# Patient Record
Sex: Female | Born: 2000 | Race: White | Hispanic: No | Marital: Single | Smoking: Never smoker
Health system: Southern US, Community
[De-identification: ages and names within clinical notes are randomized; demographics above are authoritative.]

## PROBLEM LIST (undated history)

## (undated) DIAGNOSIS — F32A Depression, unspecified: Secondary | ICD-10-CM

## (undated) DIAGNOSIS — Z8742 Personal history of other diseases of the female genital tract: Secondary | ICD-10-CM

## (undated) DIAGNOSIS — N83519 Torsion of ovary and ovarian pedicle, unspecified side: Secondary | ICD-10-CM

## (undated) DIAGNOSIS — F419 Anxiety disorder, unspecified: Secondary | ICD-10-CM

## (undated) HISTORY — DX: Personal history of other diseases of the female genital tract: Z87.42

## (undated) HISTORY — DX: Anxiety disorder, unspecified: F41.9

## (undated) HISTORY — DX: Torsion of ovary and ovarian pedicle, unspecified side: N83.519

## (undated) HISTORY — DX: Depression, unspecified: F32.A

## (undated) HISTORY — PX: OTHER SURGICAL HISTORY: SHX169

---

## 2021-06-01 ENCOUNTER — Emergency Department
Admission: EM | Admit: 2021-06-01 | Discharge: 2021-06-02 | Disposition: A | Discharge status: Home / Self Care | Payer: BC Managed Care – PPO | Attending: Emergency Medicine | Admitting: Emergency Medicine

## 2021-06-01 ENCOUNTER — Other Ambulatory Visit: Payer: Self-pay

## 2021-06-01 DIAGNOSIS — R1013 Epigastric pain: Secondary | ICD-10-CM | POA: Diagnosis present

## 2021-06-01 DIAGNOSIS — R197 Diarrhea, unspecified: Secondary | ICD-10-CM | POA: Insufficient documentation

## 2021-06-01 DIAGNOSIS — K8020 Calculus of gallbladder without cholecystitis without obstruction: Secondary | ICD-10-CM

## 2021-06-01 DIAGNOSIS — D72829 Elevated white blood cell count, unspecified: Secondary | ICD-10-CM | POA: Insufficient documentation

## 2021-06-01 LAB — COMPREHENSIVE METABOLIC PANEL
ALT: 14 U/L (ref 0–44)
AST: 19 U/L (ref 15–41)
Albumin: 3.7 g/dL (ref 3.5–5.0)
Alkaline Phosphatase: 72 U/L (ref 38–126)
Anion gap: 13 (ref 5–15)
BUN: 16 mg/dL (ref 6–20)
CO2: 18 mmol/L — ABNORMAL LOW (ref 22–32)
Calcium: 8.9 mg/dL (ref 8.9–10.3)
Chloride: 106 mmol/L (ref 98–111)
Creatinine, Ser: 0.63 mg/dL (ref 0.44–1.00)
GFR, Estimated: >60 mL/min (ref >60)
Glucose, Bld: 113 mg/dL — ABNORMAL HIGH (ref 70–99)
Potassium: 3.5 mmol/L (ref 3.5–5.1)
Sodium: 137 mmol/L (ref 135–145)
Total Bilirubin: 0.4 mg/dL (ref 0.3–1.2)
Total Protein: 7.7 g/dL (ref 6.5–8.1)

## 2021-06-01 LAB — POC URINE PREG, ED: Preg Test, Ur: NEGATIVE

## 2021-06-01 LAB — URINALYSIS, ROUTINE W REFLEX MICROSCOPIC
Bilirubin Urine: NEGATIVE
Glucose, UA: NEGATIVE mg/dL
Hgb urine dipstick: NEGATIVE
Ketones, ur: NEGATIVE mg/dL
Leukocytes,Ua: NEGATIVE
Nitrite: NEGATIVE
Protein, ur: NEGATIVE mg/dL
Specific Gravity, Urine: 1.02 (ref 1.005–1.030)
pH: 6 (ref 5.0–8.0)

## 2021-06-01 LAB — CBC
HCT: 44.2 % (ref 36.0–46.0)
Hemoglobin: 14.1 g/dL (ref 12.0–15.0)
MCH: 26 pg (ref 26.0–34.0)
MCHC: 31.9 g/dL (ref 30.0–36.0)
MCV: 81.4 fL (ref 80.0–100.0)
Platelets: 448 10*3/uL — ABNORMAL HIGH (ref 150–400)
RBC: 5.43 MIL/uL — ABNORMAL HIGH (ref 3.87–5.11)
RDW: 13.4 % (ref 11.5–15.5)
WBC: 21.5 10*3/uL — ABNORMAL HIGH (ref 4.0–10.5)
nRBC: 0 % (ref 0.0–0.2)

## 2021-06-01 LAB — LIPASE, BLOOD: Lipase: 26 U/L (ref 11–51)

## 2021-06-01 MED ORDER — ONDANSETRON HCL 4 MG/2ML IJ SOLN
4.0000 mg | Freq: Once | INTRAMUSCULAR | Status: AC | PRN
Start: 2021-06-01 — End: 2021-06-01
  Administered 2021-06-01: 4 mg via INTRAVENOUS
  Filled 2021-06-01: qty 2

## 2021-06-01 NOTE — ED Provider Notes (Signed)
? ?[facility] ?Provider Note ? ? ? Event Date/Time  ? First MD Initiated Contact with Patient 06/01/21 2356   ?  (approximate) ? ? ?History  ? ?Diarrhea, Emesis, and Abdominal Pain ? ? ?HPI ? ?Victoria Underwood is a 21 y.o. female who presents to the ED for evaluation of Diarrhea, Emesis, and Abdominal Pain ?  ?Morbidly obese patient without much history in the chart.  No documented surgical history within the abdomen. ? ?Patient presents to the ED, accompanied by her sister, for evaluation of acute epigastric abdominal pain, emesis and an episode of diarrhea.  She reports having crab legs for dinner tonight, and about 1.5 hours after that she developed epigastric pain and recurrent nonbloody nonbilious emesis.  Reports an episode of watery diarrhea alongside this without melena, hematochezia or hematemesis. ? ?Denies fevers or pain prior to tonight's.  Denies known history of cholelithiasis.  Denies dysuria, hematuria, novel vaginal discharge or bleeding. ? ?She does self-reports a history of ovarian torsion on the left side requiring "microsurgery" when she was 21 years old. ? ?Physical Exam  ? ?Triage Vital Signs: ?ED Triage Vitals [06/01/21 2243]  ?Enc Vitals Group  ?   BP 123/63  ?   Pulse Rate 95  ?   Resp (!) 22  ?   Temp 97.6 ?F (36.4 ?C)  ?   Temp Source Oral  ?   SpO2 100 %  ?   Weight 286 lb 2.5 oz (129.8 kg)  ?   Height 5\' 5"  (1.651 m)  ?   Head Circumference   ?   Peak Flow   ?   Pain Score 7  ?   Pain Loc   ?   Pain Edu?   ?   Excl. in [place]?   ? ? ?Most recent vital signs: ?Vitals:  ? 06/02/21 0020 06/02/21 0030  ?BP: 127/79 130/78  ?Pulse: 99 94  ?Resp: 19 20  ?Temp:  98.5 ?F (36.9 ?C)  ?SpO2: 100% 99%  ? ? ?General: Awake, no distress.  Morbidly obese, well-appearing and conversational. ?CV:  Good peripheral perfusion.  ?Resp:  Normal effort.  ?Abd:  No distention.  Epigastric and RUQ tenderness without guarding or peritoneal features.  Remainder of the abdomen is  benign. ?MSK:  No deformity noted.  ?Neuro:  No focal deficits appreciated. ?Other:   ? ? ?ED Results / Procedures / Treatments  ? ?Labs ?(all labs ordered are listed, but only abnormal results are displayed) ?Labs Reviewed  ?COMPREHENSIVE METABOLIC PANEL - Abnormal; Notable for the following components:  ?    Result Value  ? CO2 18 (*)   ? Glucose, Bld 113 (*)   ? All other components within normal limits  ?CBC - Abnormal; Notable for the following components:  ? WBC 21.5 (*)   ? RBC 5.43 (*)   ? Platelets 448 (*)   ? All other components within normal limits  ?URINALYSIS, ROUTINE W REFLEX MICROSCOPIC - Abnormal; Notable for the following components:  ? Color, Urine YELLOW (*)   ? APPearance HAZY (*)   ? All other components within normal limits  ?LIPASE, BLOOD  ?PREGNANCY, URINE  ?POC URINE PREG, ED  ? ? ?EKG ? ? ?RADIOLOGY ?RUQ ultrasound reviewed by me with cholelithiasis without evidence of cholecystitis  ? ?Official radiology report(s): ?US ABDOMEN LIMITED RUQ (LIVER/GB) ? ?Result Date: 06/02/2021 ?CLINICAL DATA:  Right upper quadrant pain EXAM: ULTRASOUND ABDOMEN LIMITED RIGHT UPPER QUADRANT COMPARISON:  None. FINDINGS: Gallbladder:  Multiple mobile gallstones measuring up to 11 mm A negative sonographic Percell Miller sign was reported by the sonographer. Common bile duct: Diameter: 5 mm Liver: There is an echogenic focus in the right hepatic lobe measuring 2.3 x 1.7 x 2.8 cm. Portal vein is patent on color Doppler imaging with normal direction of blood flow towards the liver. Other: None. IMPRESSION: 1. Cholelithiasis without other evidence of acute cholecystitis. 2. Echogenic right hepatic lobe lesion, possibly focal fatty infiltration or a hemangioma. Electronically Signed   By: Ulyses Jarred M.D.   On: 06/02/2021 00:42   ? ?PROCEDURES and INTERVENTIONS: ? ?.1-3 Lead EKG Interpretation ?Performed by: Vladimir Crofts, MD ?Authorized by: Vladimir Crofts, MD  ? ?  Interpretation: normal   ?  ECG rate:  90 ?  ECG rate  assessment: normal   ?  Rhythm: sinus rhythm   ?  Ectopy: none   ?  Conduction: normal   ? ?Medications  ?ondansetron (ZOFRAN) injection 4 mg (4 mg Intravenous Given 06/01/21 2248)  ? ? ? ?IMPRESSION / MDM / ASSESSMENT AND PLAN / ED COURSE  ?I reviewed the triage vital signs and the nursing notes. ? ?Morbidly obese 21 year old woman presents to the ED with postprandial emesis and epigastric pain, with evidence of cholelithiasis without cholecystitis and suitable for outpatient management with surgical follow-up.  Vital signs are reassuring and normal.  Examination with mild epigastric and RUQ tenderness without guarding or peritoneal features.  She looks systemically well.  Leukocytosis to 21,000 as noted as well as slightly decreased bicarbonate.  Normal electrolytes and renal function.  No evidence of pancreatitis, cystitis or pregnancy.  RUQ ultrasound with evidence of gallstones without cholecystitis sonographically.  No signs of biliary obstruction.  While her leukocytosis may be suggestive of cholecystitis, clinically and overall she does not have signs of this.  She is tolerating p.o. intake and I believe she is suitable for outpatient management.  Return precautions discussed and referred to surgery. ? ?Clinical Course as of 06/02/21 0118  ?Nancy Fetter Jun 02, 2021  ?0055 Reassessed.  Patient reports feeling fine.  Continues to have some mild tenderness to deep palpation but no guarding or peritoneal features.  We discussed gallstones without evidence of cholecystitis.  Discussed management at home and following up with surgeon.  We discussed return precautions.  Answered questions [DS]  ?  ?Clinical Course User Index ?[DS] Vladimir Crofts, MD  ? ? ? ?FINAL CLINICAL IMPRESSION(S) / ED DIAGNOSES  ? ?Final diagnoses:  ?Calculus of gallbladder without cholecystitis without obstruction  ? ? ? ?Rx / DC Orders  ? ?ED Discharge Orders   ? ?      Ordered  ?  ondansetron (ZOFRAN-ODT) 4 MG disintegrating tablet  Every 8 hours PRN        ? 06/02/21 0118  ? ?  ?  ? ?  ? ? ? ?Note:  This document was prepared using Dragon voice recognition software and may include unintentional dictation errors. ?  ?Vladimir Crofts, MD ?06/02/21 0118 ? ?

## 2021-06-01 NOTE — ED Triage Notes (Signed)
Pt states she started having diarrhea/ vomiting and epigastric pain starting around an hour and a half ago.  ?

## 2021-06-02 ENCOUNTER — Emergency Department: Payer: BC Managed Care – PPO

## 2021-06-02 LAB — PREGNANCY, URINE: Preg Test, Ur: NEGATIVE

## 2021-06-02 MED ORDER — ONDANSETRON 4 MG PO TBDP
4.0000 mg | ORAL_TABLET | Freq: Three times a day (TID) | ORAL | 0 refills | Status: DC | PRN
Start: 2021-06-02 — End: 2021-09-16

## 2021-06-02 NOTE — ED Notes (Signed)
Pt was given cracker and water.  ?

## 2021-06-02 NOTE — ED Notes (Signed)
Pt present to ED with NVD and epigastric abdominal pain that started last night after eating some seafood. Pt states she vomited 4-5 times today. Pt states her pain eased up after receiving pain medication.  ?

## 2021-06-02 NOTE — ED Notes (Signed)
Pt verbalized understanding of discharge instructions, follow-up care instructions, and prescriptions. Pt advised if symptoms worsen to return to ED.  ?

## 2021-06-02 NOTE — Discharge Instructions (Addendum)
Please take Tylenol and ibuprofen/Advil for your pain.  It is safe to take them together, or to alternate them every few hours.  Take up to 1000mg  of Tylenol at a time, up to 4 times per day.  Do not take more than 4000 mg of Tylenol in 24 hours.  For ibuprofen, take 400-600 mg, 4-5 times per day. ? ?Use Zofran as needed for any further nausea and vomiting. ? ?If you develop any unbearable pain, fevers and pain or further worsening symptoms, please return to the ED. ?

## 2021-06-02 NOTE — ED Notes (Signed)
Pt tolerated crackers and water well. No c/o nausea or vomiting.  ?

## 2021-06-14 ENCOUNTER — Ambulatory Visit: Payer: Self-pay | Admitting: General Surgery

## 2021-06-14 NOTE — H&P (Signed)
?PATIENT PROFILE: ?Victoria Underwood is a 21 y.o. female who presents to the Clinic for consultation at the request of Dr. 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 ? ?PCP:  Pcp, No ? ?HISTORY OF PRESENT ILLNESS: ?Victoria Underwood reports she had an episode of severe epigastric pain.  Epigastric pain radiates to the right upper quadrant.  Pain was exacerbated by eating crab.  There was no alleviating factors.  She went to the ED due to the severity of the pain.  At the ED she had an ultrasound that was found with cholelithiasis.  There was no gallbladder wall thickening or pericholecystic edema.  I personally evaluated the images.  She also had leukocytosis.  The liver enzymes were within normal limits.  I personally evaluated the labs from the ED visit.  Since the pain completely resolved at the moment and ultrasound findings were not consistent with cholecystitis she was discharged home.  She endorses that she has not had any more pain episode like this. ? ? ?PROBLEM LIST: ?Cholelithiasis ?Morbid obesity ? ?GENERAL REVIEW OF SYSTEMS:  ? ?General ROS: negative for - chills, fatigue, fever, weight gain or weight loss ?Allergy and Immunology ROS: negative for - hives  ?Hematological and Lymphatic ROS: negative for - bleeding problems or bruising, negative for palpable nodes ?Endocrine ROS: negative for - heat or cold intolerance, hair changes ?Respiratory ROS: negative for - cough, shortness of breath or wheezing ?Cardiovascular ROS: no chest pain or palpitations ?GI ROS: negative for nausea, vomiting, diarrhea, constipation.  Positive for abdominal pain ?Musculoskeletal ROS: negative for - joint swelling or muscle pain ?Neurological ROS: negative for - confusion, syncope ?Dermatological ROS: negative for pruritus and rash ?Psychiatric: negative for anxiety, depression, difficulty sleeping and memory loss ? ?MEDICATIONS: ?Current Outpatient Medications  ?Medication Sig Dispense Refill  ? FLUoxetine  (PROZAC) 10 MG capsule Take 10 mg by mouth once daily    ? ?No current facility-administered medications for this visit.  ? ? ?ALLERGIES: ?Patient has no known allergies. ? ?PAST MEDICAL HISTORY: ?Morbid obesity ? ?PAST SURGICAL HISTORY: ?Patient denies previous surgical history ? ?FAMILY HISTORY: ?Family history reviewed.  No pertinent family history ? ?SOCIAL HISTORY: ?Social History  ? ?Socioeconomic History  ? Marital status: Single  ?Tobacco Use  ? Smoking status: Never  ? Smokeless tobacco: Never  ?Substance and Sexual Activity  ? Alcohol use: Never  ? Drug use: Never  ? ? ?PHYSICAL EXAM: ?Vitals:  ? 06/13/21 1043  ?BP: (!) 149/88  ?Pulse: 99  ? ?Body mass index is 47.43 kg/m?. ?Weight: (!) 129.3 kg (285 lb)  ? ?GENERAL: Alert, active, oriented x3 ? ?HEENT: Pupils equal reactive to light. Extraocular movements are intact. Sclera clear. Palpebral conjunctiva normal red color.Pharynx clear. ? ?NECK: Supple with no palpable mass and no adenopathy. ? ?LUNGS: Sound clear with no rales rhonchi or wheezes. ? ?HEART: Regular rhythm S1 and S2 without murmur. ? ?ABDOMEN: Soft and depressible, nontender with no palpable mass, no hepatomegaly.  ? ?EXTREMITIES: Well-developed well-nourished symmetrical with no dependent edema. ? ?NEUROLOGICAL: Awake alert oriented, facial expression symmetrical, moving all extremities. ? ?REVIEW OF DATA: ?I have reviewed the following data today: ?No results found for any previous visit.  ?  ? ?ASSESSMENT: ?Victoria Underwood is a 21 y.o. female presenting for consultation for cholelithiasis.   ? ?Patient was oriented about the diagnosis of cholelithiasis. Also oriented about what is the gallbladder, its anatomy and function and the implications of having stones. The patient was oriented about the treatment alternatives (  observation vs cholecystectomy). Patient was oriented that a low percentage of patient will continue to have similar pain symptoms even after the gallbladder is removed.  Surgical technique (open vs laparoscopic) was discussed. It was also discussed the goals of the surgery (decrease the pain episodes and avoid the risk of cholecystitis) and the risk of surgery including: bleeding, infection, common bile duct injury, stone retention, injury to other organs such as bowel, liver, stomach, other complications such as hernia, bowel obstruction among others. Also discussed with patient about anesthesia and its complications such as: reaction to medications, pneumonia, heart complications, death, among others.   Patient endorses she understood and agreed to proceed after sister's wedding. ? ?I also had a discussion with the patient and her mother regarding the patient's weight.  Patient has a BMI of 47.4.  I highly recommend the patient to have an evaluation at the weight loss clinic for discussion of alternative of healthy weight loss. ? ?Cholelithiasis without cholecystitis [K80.20] ? ?PLAN: ?1.  Robotic assisted laparoscopic cholecystectomy (18841) ?2.  CBC, CMP (done) ?3.  Do not take aspirin 5 days before the procedure ?4.  Contact us if has any question or concern.  ?5.  Outpatient referral to to Weight Loss Clinic ? ?Patient and and mother verbalized understanding, all questions were answered, and were agreeable with the plan outlined above.  ? ? ? ?Carolan Shiver, MD ? ?Electronically signed by Carolan Shiver, MD ?

## 2021-07-17 ENCOUNTER — Inpatient Hospital Stay
Admission: RE | Admit: 2021-07-17 | Discharge: 2021-07-17 | Disposition: A | Discharge status: Home / Self Care | Payer: BC Managed Care – PPO | Source: Ambulatory Visit

## 2021-07-17 NOTE — Patient Instructions (Signed)
Your procedure is scheduled on: 07/24/21 - Wednesday Report to the Registration Desk on the 1st floor of the [facility]. To find out your arrival time, please call [phone] between 1PM - 3PM on: 07/23/21 - Tuesday If your arrival time is 6:00 am, do not arrive prior to that time as the [facility] entrance doors do not open until 6:00 am.  REMEMBER: Instructions that are not followed completely may result in serious medical risk, up to and including death; or upon the discretion of your surgeon and anesthesiologist your surgery may need to be rescheduled.  Do not eat food after midnight the night before surgery.  No gum chewing, lozengers or hard candies.  TAKE THESE MEDICATIONS THE MORNING OF SURGERY WITH A SIP OF WATER: NONE  One week prior to surgery: Stop Anti-inflammatories (NSAIDS) such as Advil, Aleve, Ibuprofen, Motrin, Naproxen, Naprosyn and Aspirin based products such as Excedrin, Goodys Powder, BC Powder.  Stop ANY OVER THE COUNTER supplements until after surgery.  You may however, continue to take Tylenol if needed for pain up until the day of surgery.  No Alcohol for 24 hours before or after surgery.  No Smoking including e-cigarettes for 24 hours prior to surgery.  No chewable tobacco products for at least 6 hours prior to surgery.  No nicotine patches on the day of surgery.  Do not use any "recreational" drugs for at least a week prior to your surgery.  Please be advised that the combination of cocaine and anesthesia may have negative outcomes, up to and including death. If you test positive for cocaine, your surgery will be cancelled.  On the morning of surgery brush your teeth with toothpaste and water, you may rinse your mouth with mouthwash if you wish. Do not swallow any toothpaste or mouthwash.  Use CHG Soap or wipes as directed on instruction sheet.  Do not wear jewelry, make-up, hairpins, clips or nail polish.  Do not wear lotions, powders, or  perfumes.   Do not shave body from the neck down 48 hours prior to surgery just in case you cut yourself which could leave a site for infection.  Also, freshly shaved skin may become irritated if using the CHG soap.  Contact lenses, hearing aids and dentures may not be worn into surgery.  Do not bring valuables to the hospital. [facility] is not responsible for any missing/lost belongings or valuables.   Notify your doctor if there is any change in your medical condition (cold, fever, infection).  Wear comfortable clothing (specific to your surgery type) to the hospital.  After surgery, you can help prevent lung complications by doing breathing exercises.  Take deep breaths and cough every 1-2 hours. Your doctor may order a device called an Incentive Spirometer to help you take deep breaths. When coughing or sneezing, hold a pillow firmly against your incision with both hands. This is called "splinting." Doing this helps protect your incision. It also decreases belly discomfort.  If you are being admitted to the hospital overnight, leave your suitcase in the car. After surgery it may be brought to your room.  If you are being discharged the day of surgery, you will not be allowed to drive home. You will need a responsible adult (18 years or older) to drive you home and stay with you that night.   If you are taking public transportation, you will need to have a responsible adult (18 years or older) with you. Please confirm with your physician that  it is acceptable to use public transportation.   Please call the [place] [facility] at [phone] if you have any questions about these instructions.  Surgery Visitation Policy:  Patients undergoing a surgery or procedure may have two family members or support persons with them as long as the person is not COVID-19 positive or experiencing its symptoms.   Inpatient Visitation:    Visiting hours are 7 a.m. to 8 p.m. Up to  four visitors are allowed at one time in a patient room, including children. The visitors may rotate out with other people during the day. One designated support person (adult) may remain overnight.

## 2021-07-24 ENCOUNTER — Encounter: Admission: RE | Payer: Self-pay | Source: Home / Self Care

## 2021-07-24 ENCOUNTER — Ambulatory Visit: Admission: RE | Admit: 2021-07-24 | Payer: BC Managed Care – PPO | Source: Home / Self Care | Admitting: General Surgery

## 2021-07-24 SURGERY — CHOLECYSTECTOMY, ROBOT-ASSISTED, LAPAROSCOPIC
Anesthesia: General | Site: Abdomen

## 2021-09-16 ENCOUNTER — Other Ambulatory Visit: Payer: Self-pay

## 2021-09-16 ENCOUNTER — Encounter: Payer: Self-pay | Admitting: Registered Nurse

## 2021-09-16 ENCOUNTER — Ambulatory Visit (INDEPENDENT_AMBULATORY_CARE_PROVIDER_SITE_OTHER): Payer: BC Managed Care – PPO | Admitting: Registered Nurse

## 2021-09-16 VITALS — BP 132/86 | HR 121 | Temp 98.1°F | Resp 18 | Ht 65.0 in | Wt 286.6 lb

## 2021-09-16 DIAGNOSIS — Z3009 Encounter for other general counseling and advice on contraception: Secondary | ICD-10-CM | POA: Diagnosis not present

## 2021-09-16 DIAGNOSIS — T753XXA Motion sickness, initial encounter: Secondary | ICD-10-CM | POA: Insufficient documentation

## 2021-09-16 DIAGNOSIS — H60501 Unspecified acute noninfective otitis externa, right ear: Secondary | ICD-10-CM

## 2021-09-16 DIAGNOSIS — Z3041 Encounter for surveillance of contraceptive pills: Secondary | ICD-10-CM

## 2021-09-16 DIAGNOSIS — F419 Anxiety disorder, unspecified: Secondary | ICD-10-CM

## 2021-09-16 DIAGNOSIS — Z0000 Encounter for general adult medical examination without abnormal findings: Secondary | ICD-10-CM | POA: Diagnosis not present

## 2021-09-16 DIAGNOSIS — F40243 Fear of flying: Secondary | ICD-10-CM

## 2021-09-16 MED ORDER — CLONAZEPAM 0.5 MG PO TBDP: 0.5000 mg | ORAL_TABLET | Freq: Three times a day (TID) | ORAL | 0 refills | Status: DC | PRN

## 2021-09-16 MED ORDER — SCOPOLAMINE 1 MG/3DAYS TD PT72: 1.0000 | MEDICATED_PATCH | TRANSDERMAL | 12 refills | Status: DC

## 2021-09-16 MED ORDER — NORGESTIMATE-ETH ESTRADIOL 0.25-35 MG-MCG PO TABS: 1.0000 | ORAL_TABLET | Freq: Every day | ORAL | 4 refills | Status: DC

## 2021-09-16 MED ORDER — OFLOXACIN 0.3 % OT SOLN: 10.0000 [drp] | Freq: Every day | OTIC | 0 refills | Status: DC

## 2021-09-16 NOTE — Progress Notes (Signed)
Complete physical exam  Patient: Victoria Underwood   DOB: 2000-05-12   21 y.o. Female  MRN: [id number] Visit Date: 09/16/2021  Subjective:    Chief Complaint  Patient presents with   New Patient (Initial Visit)    Patient states she is here to establish care and discuss motion sickness    Victoria Underwood is a 21 y.o. female who presents today for a complete physical exam. She reports consuming a general diet.     She generally feels well. She reports sleeping well. She does have additional problems to discuss today.   Vision:Within the last year Dental:Within Last 6 months STD Screen:No PSA:No  Ear Pain R ear. Canal pain and mild outer ear pain Some local lymph pain No drainage Occ muffled hearing. No other upper respiratory symptoms.  Motion Sickness Worst on planes. Concern about anxiety component Has tried a number of OTC remedies without relief. Would like to try rx.  BCM Has been on COCs for some time.  Would like to continue. No AE. Good compliance.   Most recent fall risk assessment:    09/16/2021    2:47 PM  Fall Risk   Falls in the past year? 0  Number falls in past yr: 0  Injury with Fall? 0  Risk for fall due to : No Fall Risks  Follow up Falls evaluation completed     Most recent depression screenings:    09/16/2021    2:47 PM  PHQ 2/9 Scores  PHQ - 2 Score 0  PHQ- 9 Score 0     Patient Active Problem List   Diagnosis Date Noted   Counseling for birth control, oral contraceptives 09/16/2021   Motion sickness 09/16/2021   Past Medical History:  Diagnosis Date   Anxiety    Depression    History reviewed. No pertinent surgical history. Social History   Tobacco Use   Smoking status: Never   Smokeless tobacco: Never  Vaping Use   Vaping Use: Never used  Substance Use Topics   Alcohol use: Never   Drug use: Never   Social History   Socioeconomic History   Marital status: Single    Spouse name: Not on file   Number of  children: Not on file   Years of education: Not on file   Highest education level: Not on file  Occupational History   Not on file  Tobacco Use   Smoking status: Never   Smokeless tobacco: Never  Vaping Use   Vaping Use: Never used  Substance and Sexual Activity   Alcohol use: Never   Drug use: Never   Sexual activity: Never  Other Topics Concern   Not on file  Social History Narrative   Not on file   Social Determinants of Health   Financial Resource Strain: Not on file  Food Insecurity: Not on file  Transportation Needs: Not on file  Physical Activity: Not on file  Stress: Not on file  Social Connections: Not on file  Intimate Partner Violence: Not on file   Family Status  Relation Name Status   Mother Thornell Sartorius (Not Specified)   Father Acupuncturist (Not Specified)   Sister Irving Burton (Not Specified)   Family History  Problem Relation Age of Onset   Depression Mother    Arthritis Father    Depression Father    Anxiety disorder Sister    No Known Allergies   Patient Care Team: Janeece Agee, NP as PCP - General (Adult Health Nurse Practitioner)  Medications: Outpatient Medications Prior to Visit  Medication Sig   atomoxetine (STRATTERA) 18 MG capsule    FLUoxetine (PROZAC) 20 MG capsule    [DISCONTINUED] norgestimate-ethinyl estradiol (ORTHO-CYCLEN) 0.25-35 MG-MCG tablet Take 1 tablet by mouth daily.   [DISCONTINUED] ondansetron (ZOFRAN-ODT) 4 MG disintegrating tablet Take 1 tablet (4 mg total) by mouth every 8 (eight) hours as needed.   No facility-administered medications prior to visit.    Review of Systems  Constitutional: Negative.   HENT: Negative.    Eyes: Negative.   Respiratory: Negative.    Cardiovascular: Negative.   Gastrointestinal: Negative.   Genitourinary: Negative.   Musculoskeletal: Negative.   Skin: Negative.   Neurological: Negative.   Psychiatric/Behavioral: Negative.    All other systems reviewed and are negative.   Last CBC Lab  Results  Component Value Date   WBC 21.5 (H) 06/01/2021   HGB 14.1 06/01/2021   HCT 44.2 06/01/2021   MCV 81.4 06/01/2021   MCH 26.0 06/01/2021   RDW 13.4 06/01/2021   PLT 448 (H) 06/01/2021   Last metabolic panel Lab Results  Component Value Date   GLUCOSE 113 (H) 06/01/2021   NA 137 06/01/2021   K 3.5 06/01/2021   CL 106 06/01/2021   CO2 18 (L) 06/01/2021   BUN 16 06/01/2021   CREATININE 0.63 06/01/2021   GFRNONAA >60 06/01/2021   CALCIUM 8.9 06/01/2021   PROT 7.7 06/01/2021   ALBUMIN 3.7 06/01/2021   BILITOT 0.4 06/01/2021   ALKPHOS 72 06/01/2021   AST 19 06/01/2021   ALT 14 06/01/2021   ANIONGAP 13 06/01/2021   Last lipids No results found for: "CHOL", "HDL", "LDLCALC", "LDLDIRECT", "TRIG", "CHOLHDL" Last hemoglobin A1c No results found for: "HGBA1C" Last thyroid functions No results found for: "TSH", "T3TOTAL", "T4TOTAL", "THYROIDAB" Last vitamin D No results found for: "25OHVITD2", "25OHVITD3", "VD25OH" Last vitamin B12 and Folate No results found for: "VITAMINB12", "FOLATE"      Objective:     BP 132/86   Pulse (!) 121   Temp 98.1 F (36.7 C) (Temporal)   Resp 18   Ht 5\' 5"  (1.651 m)   Wt 286 lb 9.6 oz (130 kg)   SpO2 99%   BMI 47.69 kg/m   BP Readings from Last 3 Encounters:  09/16/21 132/86  06/02/21 130/78   Wt Readings from Last 3 Encounters:  09/16/21 286 lb 9.6 oz (130 kg)  06/01/21 286 lb 2.5 oz (129.8 kg) (>99 %, Z= 2.78)*   * Growth percentiles are based on CDC (Girls, 2-20 Years) data.   SpO2 Readings from Last 3 Encounters:  09/16/21 99%  06/02/21 99%      Physical Exam Vitals and nursing note reviewed.  Constitutional:      General: She is not in acute distress.    Appearance: Normal appearance. She is not ill-appearing, toxic-appearing or diaphoretic.  HENT:     Head: Normocephalic and atraumatic.     Right Ear: Tympanic membrane, ear canal and external ear normal. There is no impacted cerumen.     Left Ear: Tympanic  membrane, ear canal and external ear normal. There is no impacted cerumen.     Nose: Nose normal. No congestion or rhinorrhea.     Mouth/Throat:     Mouth: Mucous membranes are moist.     Pharynx: Oropharynx is clear. No oropharyngeal exudate or posterior oropharyngeal erythema.  Eyes:     General: No scleral icterus.       Right eye: No discharge.  Left eye: No discharge.     Extraocular Movements: Extraocular movements intact.     Conjunctiva/sclera: Conjunctivae normal.     Pupils: Pupils are equal, round, and reactive to light.  Neck:     Vascular: No carotid bruit.  Cardiovascular:     Rate and Rhythm: Normal rate and regular rhythm.     Pulses: Normal pulses.     Heart sounds: Normal heart sounds. No murmur heard.    No friction rub. No gallop.  Pulmonary:     Effort: Pulmonary effort is normal. No respiratory distress.     Breath sounds: Normal breath sounds. No stridor. No wheezing, rhonchi or rales.  Chest:     Chest wall: No tenderness.  Abdominal:     General: Abdomen is flat. Bowel sounds are normal. There is no distension.     Palpations: There is no mass.     Tenderness: There is no abdominal tenderness. There is no right CVA tenderness, left CVA tenderness, guarding or rebound.     Hernia: No hernia is present.  Musculoskeletal:        General: No swelling, tenderness, deformity or signs of injury. Normal range of motion.     Cervical back: Normal range of motion and neck supple. No rigidity or tenderness.     Right lower leg: No edema.     Left lower leg: No edema.  Lymphadenopathy:     Cervical: No cervical adenopathy.  Skin:    General: Skin is warm and dry.     Capillary Refill: Capillary refill takes less than 2 seconds.     Coloration: Skin is not jaundiced or pale.     Findings: No bruising, erythema, lesion or rash.  Neurological:     General: No focal deficit present.     Mental Status: She is alert and oriented to person, place, and time.  Mental status is at baseline.     Cranial Nerves: No cranial nerve deficit.     Sensory: No sensory deficit.     Motor: No weakness.     Coordination: Coordination normal.     Gait: Gait normal.     Deep Tendon Reflexes: Reflexes normal.  Psychiatric:        Mood and Affect: Mood normal.        Behavior: Behavior normal.        Thought Content: Thought content normal.        Judgment: Judgment normal.      No results found for any visits on 09/16/21.    Assessment & Plan:    Routine Health Maintenance and Physical Exam   There is no immunization history on file for this patient.  Health Maintenance  Topic Date Due   Hepatitis C Screening  Never done   COVID-19 Vaccine (1) 10/02/2021 (Originally 12/26/2001)   HPV VACCINES (1 - 2-dose series) 09/17/2022 (Originally 06/25/2012)   TETANUS/TDAP  09/17/2022 (Originally 06/25/2020)   HIV Screening  09/17/2022 (Originally 06/25/2016)   INFLUENZA VACCINE  09/24/2021    Discussed health benefits of physical activity, and encouraged her to engage in regular exercise appropriate for her age and condition.  Problem List Items Addressed This Visit       Other   Counseling for birth control, oral contraceptives   Relevant Medications   norgestimate-ethinyl estradiol (ORTHO-CYCLEN) 0.25-35 MG-MCG tablet   Motion sickness   Relevant Medications   clonazePAM (KLONOPIN) 0.5 MG disintegrating tablet   scopolamine (TRANSDERM-SCOP) 1 MG/3DAYS   Other Visit  Diagnoses     Annual physical exam    -  Primary   Acute otitis externa of right ear, unspecified type       Relevant Medications   ofloxacin (FLOXIN) 0.3 % OTIC solution      Return in about 1 year (around 09/17/2022) for CPE and labs.     PLAN Exam notable for otitis externa. Treat with ofloxacin drops Otherwise exam unremarkable Refill COCs Given clonazepam and scopolamine for motion sickness. Reviewed risks, benefits, and side effects, pt voices understanding. Patient  encouraged to call clinic with any questions, comments, or concerns.   Maximiano Coss, NP

## 2021-09-16 NOTE — Patient Instructions (Addendum)
  Lizzy -  Great to meet you!  Call with concerns.  Let me know if the clonazepam doesn't work.  Physical and labs next year.   I recommend:  Jarold Motto, PA Jacquiline Doe, MD Glenetta Hew, MD Letta Moynahan Early, NP Jiles Prows, DNP Eileen Stanford, MD  Thank you! Stay Well,  Luan Pulling    If you have lab work done today you will be contacted with your lab results within the next 2 weeks.  If you have not heard from [place] then please contact us. The fastest way to get your results is to register for My Chart.   IF you received an x-ray today, you will receive an invoice from [facility]. Please contact [facility] at [phone] with questions or concerns regarding your invoice.   IF you received labwork today, you will receive an invoice from [facility]. Please contact [facility] at [phone] with questions or concerns regarding your invoice.   Our billing staff will not be able to assist you with questions regarding bills from these companies.  You will be contacted with the lab results as soon as they are available. The fastest way to get your results is to activate your My Chart account. Instructions are located on the last page of this paperwork. If you have not heard from [place] regarding the results in 2 weeks, please contact this office.

## 2021-09-19 ENCOUNTER — Encounter: Payer: Self-pay | Admitting: Registered Nurse

## 2021-09-20 ENCOUNTER — Other Ambulatory Visit: Payer: Self-pay | Admitting: Registered Nurse

## 2021-09-20 DIAGNOSIS — T753XXA Motion sickness, initial encounter: Secondary | ICD-10-CM

## 2021-09-23 ENCOUNTER — Other Ambulatory Visit: Payer: Self-pay | Admitting: Registered Nurse

## 2021-09-23 DIAGNOSIS — F40243 Fear of flying: Secondary | ICD-10-CM

## 2021-09-23 DIAGNOSIS — T753XXA Motion sickness, initial encounter: Secondary | ICD-10-CM

## 2021-09-23 MED ORDER — CLONAZEPAM 0.5 MG PO TABS: 0.5000 mg | ORAL_TABLET | Freq: Two times a day (BID) | ORAL | 1 refills | Status: DC | PRN

## 2021-09-23 NOTE — Addendum Note (Signed)
Addended by: Janeece Agee on: 09/23/2021 09:52 AM   Modules accepted: Orders

## 2021-10-18 ENCOUNTER — Ambulatory Visit (INDEPENDENT_AMBULATORY_CARE_PROVIDER_SITE_OTHER): Payer: BC Managed Care – PPO | Admitting: Adult Health

## 2021-10-18 ENCOUNTER — Encounter: Payer: Self-pay | Admitting: Adult Health

## 2021-10-18 VITALS — BP 140/80 | HR 100 | Ht 65.0 in | Wt 285.0 lb

## 2021-10-18 DIAGNOSIS — G4701 Insomnia due to medical condition: Secondary | ICD-10-CM

## 2021-10-18 DIAGNOSIS — F331 Major depressive disorder, recurrent, moderate: Secondary | ICD-10-CM

## 2021-10-18 DIAGNOSIS — F411 Generalized anxiety disorder: Secondary | ICD-10-CM | POA: Diagnosis not present

## 2021-10-18 DIAGNOSIS — F902 Attention-deficit hyperactivity disorder, combined type: Secondary | ICD-10-CM

## 2021-10-18 MED ORDER — FLUOXETINE HCL 10 MG PO CAPS: ORAL_CAPSULE | ORAL | 5 refills | Status: DC

## 2021-10-18 MED ORDER — HYDROXYZINE HCL 25 MG PO TABS: ORAL_TABLET | ORAL | 5 refills | Status: DC

## 2021-10-18 MED ORDER — ATOMOXETINE HCL 18 MG PO CAPS: 18.0000 mg | ORAL_CAPSULE | Freq: Two times a day (BID) | ORAL | 5 refills | Status: DC

## 2021-10-18 NOTE — Progress Notes (Signed)
[facility] MD/PA/NP Initial Note  10/18/2021 4:28 PM Victoria Underwood  MRN:  [id number]  Chief Complaint:   HPI:   Patient seen today for initial psychiatric evaluation.   Accompanied by mother.  Describes mood today as "ok". Pleasant. Denies tearfulness Mood symptoms - denies current depressive symptoms - struggled last semester until she was started on Prozac. Reports anxiety - more sometimes than others - situationally driven. Denies irritability. Reports worry and rumination. Mood is consistent. Currently seen by only tele-health provider since February 2023. Current medications are helpful and would like to continue. Stable interest and motivation. Taking medications as prescribed.  Energy levels stable. Active, does not have a regular exercise routine. Walking to class. Enjoys some usual interests and activities. Single. Has a boyfriend of 2 years - lives in [place]. Spending time with family. Parents local - has 2 brothers and a sister. Appetite adequate.  Weight stable - 285 pounds. Sleeps well most nights. Averages 6 to 7 broken hours. Focus and concentration stable with Stratera. Completing tasks. Managing aspects of household. 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 Lives on campus - has a room-mate. Having some adjustment issues and would like to have her pet bunny "Domingo Cocking" with her. He has been a positive source for the emotional support she's needed since being away from home and parents. Looking for a part-time job.  Denies SI or HI.  Denies AH or VH. Denies self harm. Denies substance use.  Previous medication trials:  Denies  Visit Diagnosis:    ICD-10-CM   1. Generalized anxiety disorder  F41.1 FLUoxetine (PROZAC) 10 MG capsule    2. Attention deficit hyperactivity disorder (ADHD), combined type  F90.2 atomoxetine (STRATTERA) 18 MG capsule    3. Major depressive disorder, recurrent episode, moderate (HCC)  F33.1 FLUoxetine (PROZAC) 10 MG capsule    4. Insomnia due to  medical condition  G47.01 hydrOXYzine (ATARAX) 25 MG tablet      Past Psychiatric History: Denies psychiatric hospitalization.   Past Medical History:  Past Medical History:  Diagnosis Date   Anxiety    Depression    No past surgical history on file.  Family Psychiatric History: Family history of mental illness - depression and anxiety.   Family History:  Family History  Problem Relation Age of Onset   Depression Mother    Arthritis Father    Depression Father    Anxiety disorder Sister     Social History:  Social History   Socioeconomic History   Marital status: Single    Spouse name: Not on file   Number of children: Not on file   Years of education: Not on file   Highest education level: Not on file  Occupational History   Not on file  Tobacco Use   Smoking status: Never   Smokeless tobacco: Never  Vaping Use   Vaping Use: Never used  Substance and Sexual Activity   Alcohol use: Never   Drug use: Never   Sexual activity: Never  Other Topics Concern   Not on file  Social History Narrative   Not on file   Social Determinants of Health   Financial Resource Strain: Not on file  Food Insecurity: Not on file  Transportation Needs: Not on file  Physical Activity: Not on file  Stress: Not on file  Social Connections: Not on file    Allergies: No Known Allergies  Metabolic Disorder Labs: No results found for: "HGBA1C", "MPG" No results found for: "PROLACTIN" No results found for: "CHOL", "  TRIG", "HDL", "CHOLHDL", "VLDL", "LDLCALC" No results found for: "TSH"  Therapeutic Level Labs: No results found for: "LITHIUM" No results found for: "VALPROATE" No results found for: "CBMZ"  Current Medications: Current Outpatient Medications  Medication Sig Dispense Refill   hydrOXYzine (ATARAX) 25 MG tablet Take one to two tablets at bedtime. 60 tablet 5   atomoxetine (STRATTERA) 18 MG capsule Take 1 capsule (18 mg total) by mouth 2 (two) times daily. 60  capsule 5   clonazePAM (KLONOPIN) 0.5 MG tablet Take 1 tablet (0.5 mg total) by mouth 2 (two) times daily as needed for anxiety. 20 tablet 1   FLUoxetine (PROZAC) 10 MG capsule Take three capsules daily. 90 capsule 5   norgestimate-ethinyl estradiol (ORTHO-CYCLEN) 0.25-35 MG-MCG tablet Take 1 tablet by mouth daily. 84 tablet 4   ofloxacin (FLOXIN) 0.3 % OTIC solution Place 10 drops into the right ear daily. 10 mL 0   scopolamine (TRANSDERM-SCOP) 1 MG/3DAYS Place 1 patch (1.5 mg total) onto the skin every 3 (three) days. 10 patch 12   No current facility-administered medications for this visit.    Medication Side Effects: none  Orders placed this visit:  No orders of the defined types were placed in this encounter.   Psychiatric Specialty Exam:  Review of Systems  Musculoskeletal:  Negative for gait problem.  Neurological:  Negative for tremors.  Psychiatric/Behavioral:         Please refer to HPI    There were no vitals taken for this visit.There is no height or weight on file to calculate BMI.  General Appearance: Casual and Neat  Eye Contact:  Good  Speech:  Clear and Coherent and Normal Rate  Volume:  Normal  Mood:  Euthymic  Affect:  Appropriate and Congruent  Thought Process:  Coherent and Descriptions of Associations: Intact  Orientation:  Full (Time, Place, and Person)  Thought Content: Logical   Suicidal Thoughts:  No  Homicidal Thoughts:  No  Memory:  WNL  Judgement:  Good  Insight:  Good  Psychomotor Activity:  Normal  Concentration:  Concentration: Good and Attention Span: Good  Recall:  Good  Fund of Knowledge: Good  Language: Good  Assets:  Communication Skills Desire for Improvement Financial Resources/Insurance Housing Intimacy Leisure Time Physical Health Resilience Social Support Talents/Skills Transportation Vocational/Educational  ADL's:  Intact  Cognition: WNL  Prognosis:  Good   Screenings:  PHQ2-9    Flowsheet Row Office Visit from  09/16/2021 in [facility]  PHQ-2 Total Score 0  PHQ-9 Total Score 0      Flowsheet Row ED from 06/01/2021 in [facility]  C-SSRS RISK CATEGORY No Risk       Receiving Psychotherapy: No   Treatment Plan/Recommendations:  Plan:  PDMP reviewed  Prozac 20mg  to30mg  daily Stratera 18mg  BID Propranolol - has a bottle  Hydroxyzine 25mg  - take 1 to 2 tablets.  Time spent with patient was 60 minutes. Greater than 50% of face to face time with patient was spent on counseling and coordination of care.    ESA letter provided for ".  RTC 4 weeks  Patient advised to contact office with any questions, adverse effects, or acute worsening in signs and symptoms.      , NP

## 2021-11-29 ENCOUNTER — Encounter: Payer: Self-pay | Admitting: Adult Health

## 2021-11-29 ENCOUNTER — Ambulatory Visit: Payer: BC Managed Care – PPO | Admitting: Adult Health

## 2021-11-29 DIAGNOSIS — G4701 Insomnia due to medical condition: Secondary | ICD-10-CM

## 2021-11-29 DIAGNOSIS — F331 Major depressive disorder, recurrent, moderate: Secondary | ICD-10-CM

## 2021-11-29 DIAGNOSIS — F902 Attention-deficit hyperactivity disorder, combined type: Secondary | ICD-10-CM | POA: Diagnosis not present

## 2021-11-29 DIAGNOSIS — F411 Generalized anxiety disorder: Secondary | ICD-10-CM

## 2021-11-29 NOTE — Progress Notes (Signed)
Victoria Underwood [id number] 02-08-2001 21 y.o.  Virtual Visit via Video Note  I connected with pt @ on 11/29/21 at  1:40 PM EDT by a video enabled telemedicine application and verified that I am speaking with the correct person using two identifiers.   I discussed the limitations of evaluation and management by telemedicine and the availability of in person appointments. The patient expressed understanding and agreed to proceed.  I discussed the assessment and treatment plan with the patient. The patient was provided an opportunity to ask questions and all were answered. The patient agreed with the plan and demonstrated an understanding of the instructions.   The patient was advised to call back or seek an in-person evaluation if the symptoms worsen or if the condition fails to improve as anticipated.  I provided 25 minutes of non-face-to-face time during this encounter.  The patient was located at home.  The provider was located at [place].   Aloha Gell, NP   Subjective:   Patient ID:  Victoria Underwood is a 21 y.o. (DOB March 09, 2000) female.  Chief Complaint: No chief complaint on file.   HPI Haileyann Staiger presents for follow-up of MDD, GAD, ADHD and insomnia.   Describes mood today as "ok". Pleasant. Denies tearfulness Mood symptoms - denies depression, anxiety and irritability.  Denies worry and rumination. Mood is consistent. Stating "I'm doing pretty good". Has settled into school - dorm - and is doing well. Was approved to have "Basil", her emotional support animal with her. Feels like current medications are helpful and would like to continue. Stable interest and motivation. Taking medications as prescribed.  Energy levels stable. Active, does not have a regular exercise routine. Walking to class. Enjoys some usual interests and activities. Dating. Has a boyfriend of 2 years - lives in [place]. Spending time with family. Parents local - has 2  brothers and a sister. Appetite adequate.  Weight stable - 285 pounds. Sleeps well most nights. Averages 6 to 7 broken hours - getting better. Focus and concentration stable with Stratera. Completing tasks. Managing aspects of household. 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 Lives on campus - has a room-mate.  Denies SI or HI.  Denies AH or VH. Denies self harm. Denies substance use.  Previous medication trials:  Denies  Review of Systems:  Review of Systems  Musculoskeletal:  Negative for gait problem.  Neurological:  Negative for tremors.  Psychiatric/Behavioral:         Please refer to HPI    Medications: I have reviewed the patient's current medications.  Current Outpatient Medications  Medication Sig Dispense Refill   atomoxetine (STRATTERA) 18 MG capsule Take 1 capsule (18 mg total) by mouth 2 (two) times daily. 60 capsule 5   clonazePAM (KLONOPIN) 0.5 MG tablet Take 1 tablet (0.5 mg total) by mouth 2 (two) times daily as needed for anxiety. 20 tablet 1   FLUoxetine (PROZAC) 10 MG capsule Take three capsules daily. 90 capsule 5   hydrOXYzine (ATARAX) 25 MG tablet Take one to two tablets at bedtime. 60 tablet 5   norgestimate-ethinyl estradiol (ORTHO-CYCLEN) 0.25-35 MG-MCG tablet Take 1 tablet by mouth daily. 84 tablet 4   ofloxacin (FLOXIN) 0.3 % OTIC solution Place 10 drops into the right ear daily. 10 mL 0   scopolamine (TRANSDERM-SCOP) 1 MG/3DAYS Place 1 patch (1.5 mg total) onto the skin every 3 (three) days. 10 patch 12   No current facility-administered medications for this visit.    Medication Side Effects: None  Allergies: No Known Allergies  Past Medical History:  Diagnosis Date   Anxiety    Depression     Family History  Problem Relation Age of Onset   Depression Mother    Arthritis Father    Depression Father    Anxiety disorder Sister     Social History   Socioeconomic History   Marital status: Single    Spouse name: Not on file   Number of  children: Not on file   Years of education: Not on file   Highest education level: Not on file  Occupational History   Not on file  Tobacco Use   Smoking status: Never   Smokeless tobacco: Never  Vaping Use   Vaping Use: Never used  Substance and Sexual Activity   Alcohol use: Never   Drug use: Never   Sexual activity: Never  Other Topics Concern   Not on file  Social History Narrative   Not on file   Social Determinants of Health   Financial Resource Strain: Not on file  Food Insecurity: Not on file  Transportation Needs: Not on file  Physical Activity: Not on file  Stress: Not on file  Social Connections: Not on file  Intimate Partner Violence: Not on file    Past Medical History, Surgical history, Social history, and Family history were reviewed and updated as appropriate.   Please see review of systems for further details on the patient's review from today.   Objective:   Physical Exam:  There were no vitals taken for this visit.  Physical Exam Constitutional:      General: She is not in acute distress. Musculoskeletal:        General: No deformity.  Neurological:     Mental Status: She is alert and oriented to person, place, and time.     Coordination: Coordination normal.  Psychiatric:        Attention and Perception: Attention and perception normal. She does not perceive auditory or visual hallucinations.        Mood and Affect: Mood normal. Mood is not anxious or depressed. Affect is not labile, blunt, angry or inappropriate.        Speech: Speech normal.        Behavior: Behavior normal.        Thought Content: Thought content normal. Thought content is not paranoid or delusional. Thought content does not include homicidal or suicidal ideation. Thought content does not include homicidal or suicidal plan.        Cognition and Memory: Cognition and memory normal.        Judgment: Judgment normal.     Comments: Insight intact     Lab Review:      Component Value Date/Time   NA 137 06/01/2021 2247   K 3.5 06/01/2021 2247   CL 106 06/01/2021 2247   CO2 18 (L) 06/01/2021 2247   GLUCOSE 113 (H) 06/01/2021 2247   BUN 16 06/01/2021 2247   CREATININE 0.63 06/01/2021 2247   CALCIUM 8.9 06/01/2021 2247   PROT 7.7 06/01/2021 2247   ALBUMIN 3.7 06/01/2021 2247   AST 19 06/01/2021 2247   ALT 14 06/01/2021 2247   ALKPHOS 72 06/01/2021 2247   BILITOT 0.4 06/01/2021 2247   GFRNONAA >60 06/01/2021 2247       Component Value Date/Time   WBC 21.5 (H) 06/01/2021 2247   RBC 5.43 (H) 06/01/2021 2247   HGB 14.1 06/01/2021 2247   HCT 44.2 06/01/2021 2247  PLT 448 (H) 06/01/2021 2247   MCV 81.4 06/01/2021 2247   MCH 26.0 06/01/2021 2247   MCHC 31.9 06/01/2021 2247   RDW 13.4 06/01/2021 2247    No results found for: "POCLITH", "LITHIUM"   No results found for: "PHENYTOIN", "PHENOBARB", "VALPROATE", "CBMZ"   .res Assessment: Plan:    Plan:  PDMP reviewed  Prozac 20mg  mg daily - did not increase to 30mg  daily Stratera 18mg  BID - taking both in the morning Propranolol - has a bottle  Hydroxyzine 25mg  - take 1 to 2 tablets - taking as needed  Time spent with patient was 25 minutes. Greater than 50% of face to face time with patient was spent on counseling and coordination of care.    ESA letter provided for ".  RTC 3 months  Patient advised to contact office with any questions, adverse effects, or acute worsening in signs and symptoms.   Diagnoses and all orders for this visit:  Generalized anxiety disorder  Attention deficit hyperactivity disorder (ADHD), combined type  Major depressive disorder, recurrent episode, moderate (HCC)  Insomnia due to medical condition     Please see After Visit Summary for patient specific instructions.  No future appointments.   No orders of the defined types were placed in this encounter.     -------------------------------

## 2022-06-24 ENCOUNTER — Ambulatory Visit (LOCAL_COMMUNITY_HEALTH_CENTER): Payer: Self-pay

## 2022-06-24 DIAGNOSIS — Z111 Encounter for screening for respiratory tuberculosis: Secondary | ICD-10-CM

## 2022-06-24 NOTE — Progress Notes (Signed)
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 ROI signed for herself and/or  sister Lucius Conn) to pick up copy of results when ready. States she has [facility] and plans to view results there. RN explained that [facility] nurse will contact her with results. RN walked pt to lab. Jerel Shepherd, RN

## 2022-06-28 LAB — QUANTIFERON-TB GOLD PLUS
QuantiFERON Mitogen Value: >10 IU/mL
QuantiFERON Nil Value: 0.03 IU/mL
QuantiFERON TB1 Ag Value: 0.04 IU/mL
QuantiFERON TB2 Ag Value: 0.03 IU/mL
QuantiFERON-TB Gold Plus: NEGATIVE

## 2022-07-01 ENCOUNTER — Telehealth: Payer: Self-pay

## 2022-07-01 NOTE — Telephone Encounter (Signed)
Attempted to call patient and sister per release of information regarding QFT [facility] results.  Voicemail does not identify patient. No message left.  Checked My Chart documentation and patient has seen results.  Copy of results left at front desk for patient. Henriette Combs, RN

## 2023-03-26 IMAGING — US US ABDOMEN LIMITED
1 series · 15 of 25 positions shown · non-contrast
Comparison: None.

CLINICAL DATA: Right upper quadrant pain

EXAM:
ULTRASOUND ABDOMEN LIMITED RIGHT UPPER QUADRANT

[Series 1: us abdomen limited ruq · 15 of 52 slices shown]
[im 1/52]
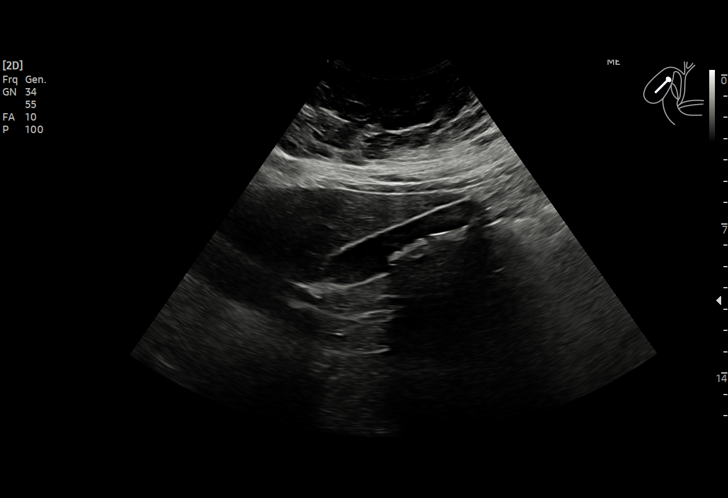
[im 5/52]
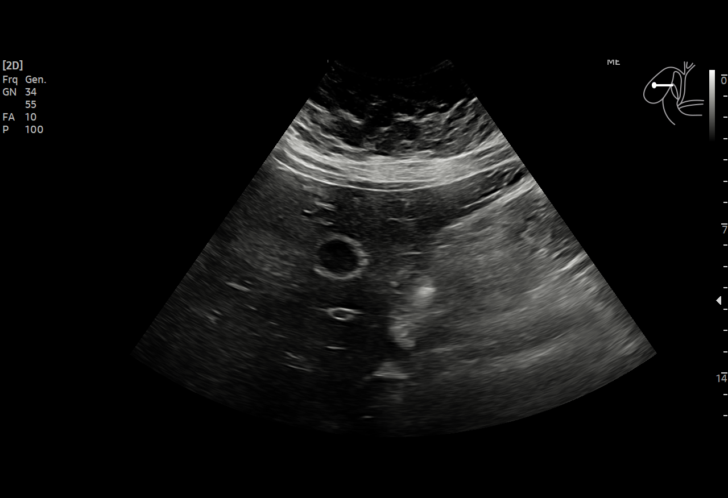
[im 9/52]
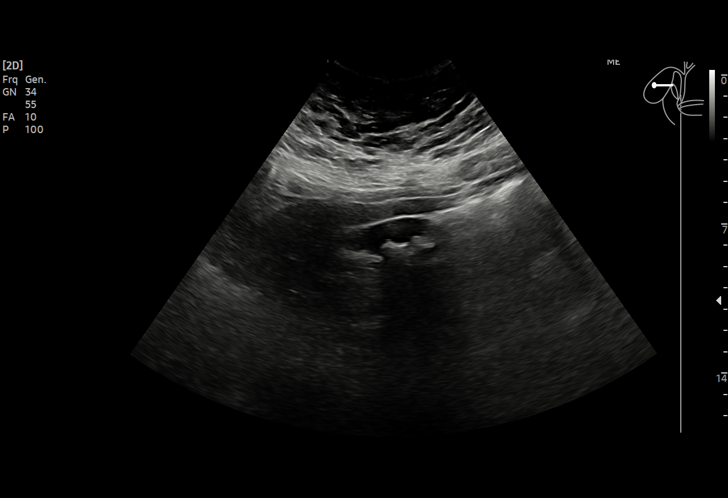
[im 11/52]
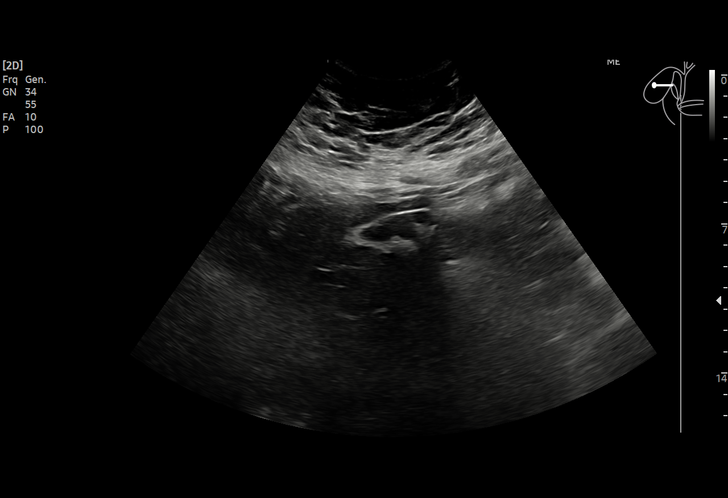
[im 15/52]
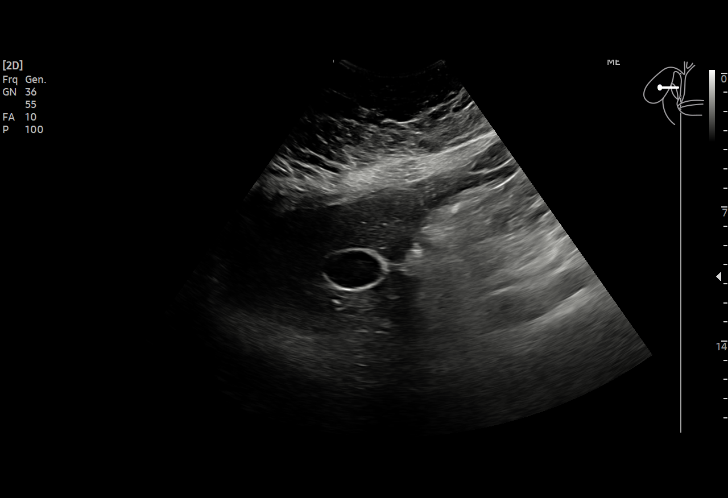
[im 20/52]
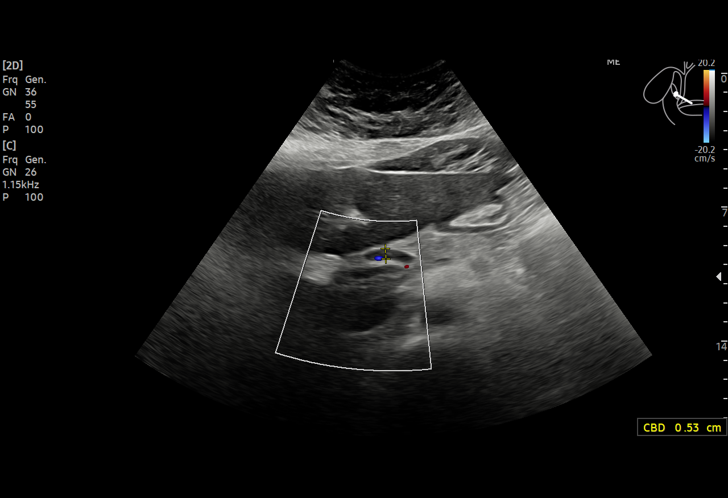
[im 22/52]
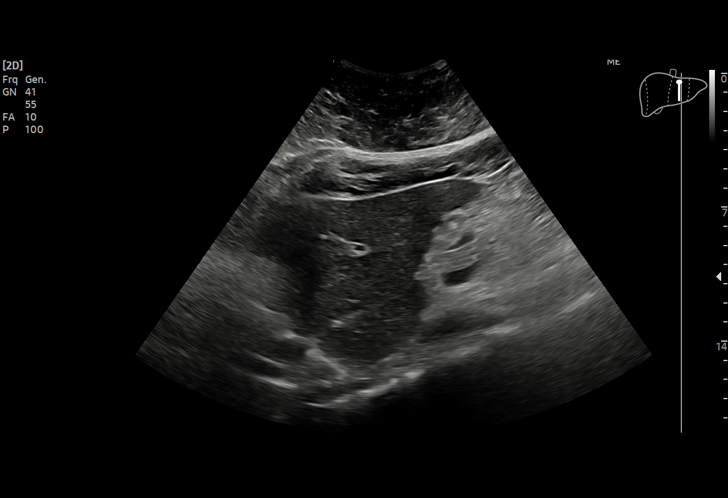
[im 26/52]
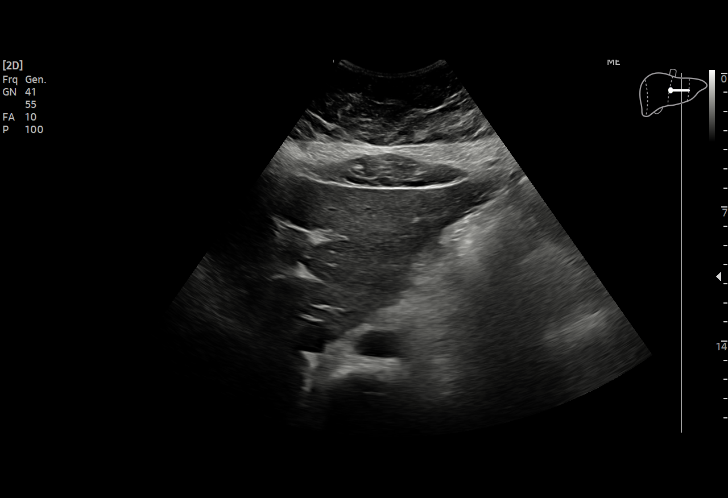
[im 30/52]
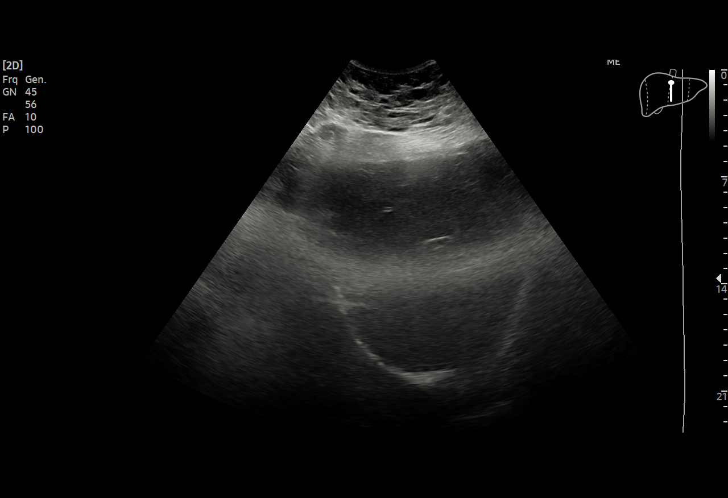
[im 32/52]
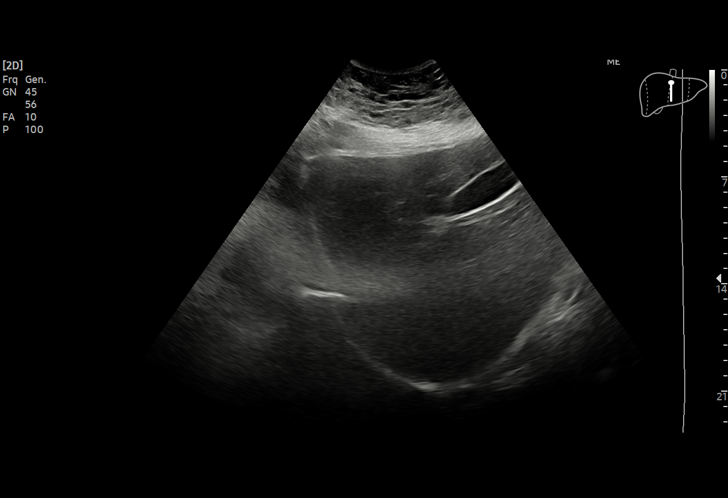
[im 37/52]
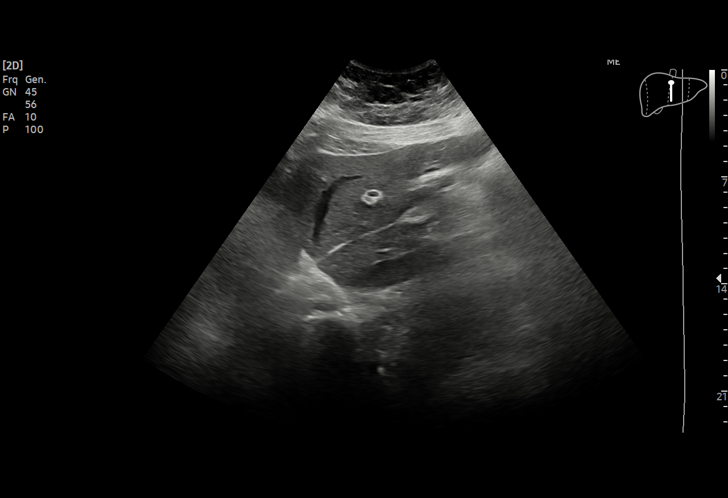
[im 41/52]
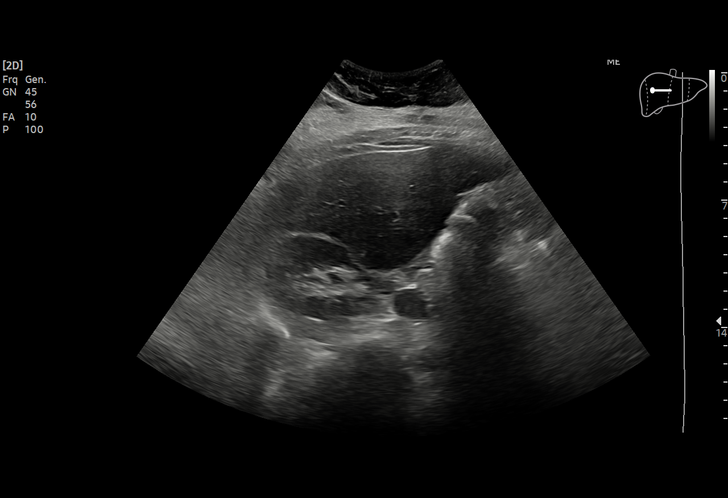
[im 43/52]
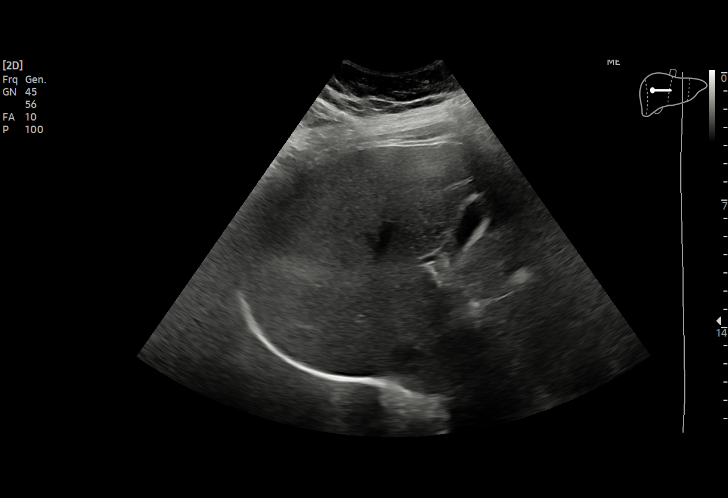
[im 47/52]
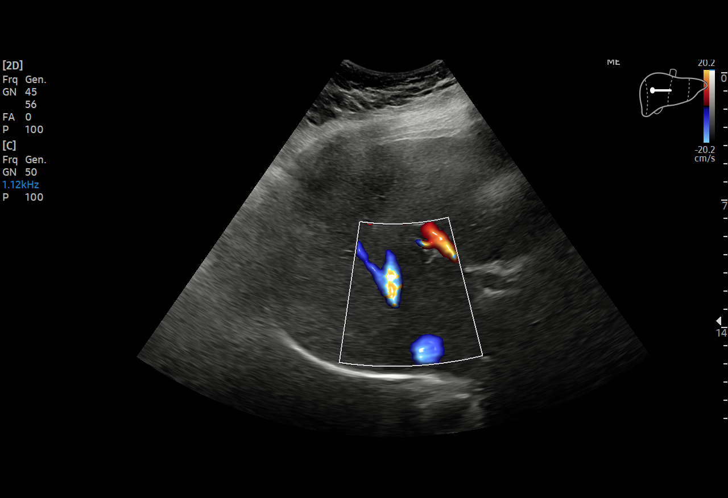
[im 52/52]
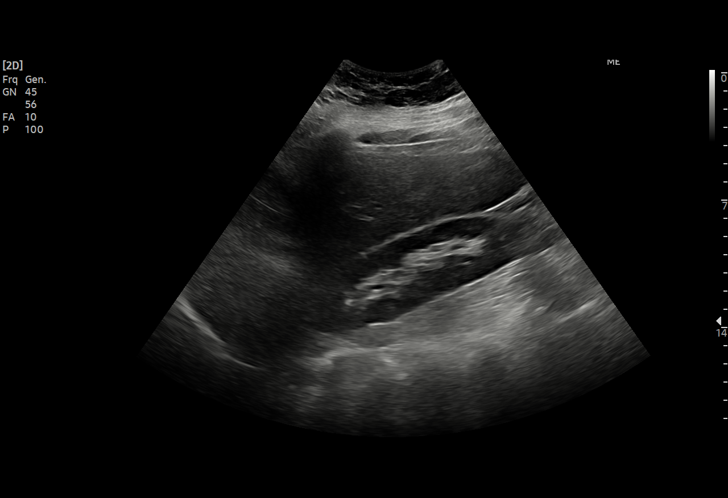

[15 of 25 positions shown; findings below may reference images not displayed]

FINDINGS: Gallbladder:

Multiple mobile gallstones measuring up to 11 mm A negative
sonographic Murphy sign was reported by the sonographer.

Common bile duct:

Diameter: 5 mm

Liver:

There is an echogenic focus in the right hepatic lobe measuring
x 1.7 x 2.8 cm. Portal vein is patent on color Doppler imaging with
normal direction of blood flow towards the liver.

Other: None.
IMPRESSION: 1. Cholelithiasis without other evidence of acute cholecystitis.
2. Echogenic right hepatic lobe lesion, possibly focal fatty
infiltration or a hemangioma.

## 2023-03-31 ENCOUNTER — Other Ambulatory Visit: Payer: Self-pay

## 2023-03-31 DIAGNOSIS — F32A Depression, unspecified: Secondary | ICD-10-CM | POA: Insufficient documentation

## 2023-03-31 DIAGNOSIS — F419 Anxiety disorder, unspecified: Secondary | ICD-10-CM | POA: Insufficient documentation

## 2023-04-01 ENCOUNTER — Encounter: Payer: Self-pay | Admitting: General Practice

## 2023-04-01 ENCOUNTER — Ambulatory Visit: Payer: BC Managed Care – PPO | Admitting: General Practice

## 2023-04-01 VITALS — BP 128/82 | HR 113 | Temp 99.0°F | Ht 66.0 in | Wt 309.0 lb

## 2023-04-01 DIAGNOSIS — Z0000 Encounter for general adult medical examination without abnormal findings: Secondary | ICD-10-CM | POA: Diagnosis not present

## 2023-04-01 DIAGNOSIS — N946 Dysmenorrhea, unspecified: Secondary | ICD-10-CM | POA: Diagnosis not present

## 2023-04-01 DIAGNOSIS — Z114 Encounter for screening for human immunodeficiency virus [HIV]: Secondary | ICD-10-CM

## 2023-04-01 DIAGNOSIS — Z1159 Encounter for screening for other viral diseases: Secondary | ICD-10-CM | POA: Diagnosis not present

## 2023-04-01 DIAGNOSIS — Z6841 Body Mass Index (BMI) 40.0 and over, adult: Secondary | ICD-10-CM

## 2023-04-01 DIAGNOSIS — F331 Major depressive disorder, recurrent, moderate: Secondary | ICD-10-CM

## 2023-04-01 DIAGNOSIS — Z124 Encounter for screening for malignant neoplasm of cervix: Secondary | ICD-10-CM

## 2023-04-01 DIAGNOSIS — F419 Anxiety disorder, unspecified: Secondary | ICD-10-CM

## 2023-04-01 DIAGNOSIS — Z7689 Persons encountering health services in other specified circumstances: Secondary | ICD-10-CM | POA: Insufficient documentation

## 2023-04-01 LAB — CBC
HCT: 42.6 % (ref 36.0–46.0)
Hemoglobin: 13.8 g/dL (ref 12.0–15.0)
MCHC: 32.4 g/dL (ref 30.0–36.0)
MCV: 79.6 fL (ref 78.0–100.0)
Platelets: 355 10*3/uL (ref 150.0–400.0)
RBC: 5.35 Mil/uL — ABNORMAL HIGH (ref 3.87–5.11)
RDW: 15 % (ref 11.5–15.5)
WBC: 7 10*3/uL (ref 4.0–10.5)

## 2023-04-01 LAB — COMPREHENSIVE METABOLIC PANEL
ALT: 23 U/L (ref 0–35)
AST: 22 U/L (ref 0–37)
Albumin: 4.5 g/dL (ref 3.5–5.2)
Alkaline Phosphatase: 92 U/L (ref 39–117)
BUN: 17 mg/dL (ref 6–23)
CO2: 24 meq/L (ref 19–32)
Calcium: 9.7 mg/dL (ref 8.4–10.5)
Chloride: 105 meq/L (ref 96–112)
Creatinine, Ser: 0.66 mg/dL (ref 0.40–1.20)
GFR: 125.09 mL/min (ref >60.00)
Glucose, Bld: 90 mg/dL (ref 70–99)
Potassium: 4.1 meq/L (ref 3.5–5.1)
Sodium: 140 meq/L (ref 135–145)
Total Bilirubin: 0.7 mg/dL (ref 0.2–1.2)
Total Protein: 7.1 g/dL (ref 6.0–8.3)

## 2023-04-01 LAB — LIPID PANEL
Cholesterol: 176 mg/dL (ref 0–200)
HDL: 37.6 mg/dL — ABNORMAL LOW (ref >39.00)
LDL Cholesterol: 119 mg/dL — ABNORMAL HIGH (ref 0–99)
NonHDL: 138.1
Total CHOL/HDL Ratio: 5
Triglycerides: 96 mg/dL (ref 0.0–149.0)
VLDL: 19.2 mg/dL (ref 0.0–40.0)

## 2023-04-01 LAB — TSH: TSH: 0.54 u[IU]/mL (ref 0.35–5.50)

## 2023-04-01 LAB — HEMOGLOBIN A1C: Hgb A1c MFr Bld: 5.3 % (ref 4.6–6.5)

## 2023-04-01 LAB — VITAMIN D 25 HYDROXY (VIT D DEFICIENCY, FRACTURES): VITD: <7 ng/mL — ABNORMAL LOW (ref 30.00–100.00)

## 2023-04-01 NOTE — Progress Notes (Signed)
 New Patient Office Visit  Subjective    Patient ID: Victoria Underwood, female    DOB: [birth year]  Age: 23 y.o. MRN: [id number]  CC:  Chief Complaint  Patient presents with   New Patient (Initial Visit)   Obesity    Wants to discuss medication options. Has not tried any meds in the past; has been recently dieting and workout started yesterday.     HPI Victoria Underwood is a 23 y.o. female presents to establish care and to discuss obesity.   Present with her mother, Kandi.   Last PCP/Physical/labs: pcp (2022), physical (at the health center)  and labs- several years ago.   Immunizations: -Tetanus: Completed in 2024 -Influenza: 2024  Diet: Fair diet.  Exercise: No regular exercise.  Eye exam: scheduled for 04/11/23. Dental exam: several years ago. Plans to get it scheduled.   Pap Smear: due/ does not want one today.   Obesity: struggled with weight since childhood. Family history of obesity and diabetes. No family history of thyroid  cancer. No personal history pancreatitis. Overall regular periods exception of 1-2 times of irregular.   Diet currently consists of: Breakfast: breakfast protein bars  Lunch: salad, sandwich Dinner: home cooked; meal prep- grilled chicken, veggies, rice.  Snacks: rice cakes, veggie with greek yogurt or seasoning Desserts: twice a week ice cream.  Beverages: diet sodas  Exercise: walks to class; started gym yesterday.    Outpatient Encounter Medications as of 04/01/2023  Medication Sig   scopolamine  (TRANSDERM-SCOP) 1 MG/3DAYS Place 1 patch (1.5 mg total) onto the skin every 3 (three) days. (Patient not taking: Reported on 04/01/2023)   [DISCONTINUED] albuterol (VENTOLIN HFA) 108 (90 Base) MCG/ACT inhaler SMARTSIG:2 Puff(s) By Mouth Every 4 Hours PRN   No facility-administered encounter medications on file as of 04/01/2023.    Past Medical History:  Diagnosis Date   Anxiety    Depression    History of ovarian cyst    Torsion of ovary      Past Surgical History:  Procedure Laterality Date   laproscopic ovarian surgery Right    age 34    Family History  Problem Relation Age of Onset   Hyperlipidemia Mother    Diabetes Mother    Arthritis Mother    Depression Mother    Obesity Mother    Hyperlipidemia Father    Arthritis Father    Depression Father    Depression Sister    Anxiety disorder Sister    Hyperlipidemia Maternal Grandmother    Diabetes Maternal Grandmother    Depression Maternal Grandmother    Arthritis Maternal Grandmother    Hyperlipidemia Maternal Grandfather    Depression Maternal Grandfather    Diabetes Maternal Grandfather    Arthritis Maternal Grandfather    Obesity Maternal Grandfather    Hyperlipidemia Paternal Grandmother    Diabetes Paternal Grandmother    Depression Paternal Grandmother    Arthritis Paternal Grandmother    Hyperlipidemia Paternal Grandfather    Depression Paternal Grandfather    Diabetes Paternal Grandfather    Cancer Paternal Grandfather    Arthritis Paternal Grandfather     Social History   Socioeconomic History   Marital status: Single    Spouse name: Not on file   Number of children: 0   Years of education: Not on file   Highest education level: Some college, no degree  Occupational History   Occupation: student  Tobacco Use   Smoking status: Never   Smokeless tobacco: Never  Vaping Use   Vaping  status: Never Used  Substance and Sexual Activity   Alcohol use: Never   Drug use: Never   Sexual activity: Never  Other Topics Concern   Not on file  Social History Narrative   Not on file   Social Drivers of Health   Financial Resource Strain: Low Risk  (03/28/2023)   Overall Financial Resource Strain (CARDIA)    Difficulty of Paying Living Expenses: Not hard at all  Food Insecurity: No Food Insecurity (03/28/2023)   Hunger Vital Sign    Worried About Running Out of Food in the Last Year: Never true    Ran Out of Food in the Last Year: Never true   Transportation Needs: No Transportation Needs (03/28/2023)   PRAPARE - Administrator, Civil Service (Medical): No    Lack of Transportation (Non-Medical): No  Physical Activity: Insufficiently Active (03/28/2023)   Exercise Vital Sign    Days of Exercise per Week: 2 days    Minutes of Exercise per Session: 20 min  Stress: No Stress Concern Present (03/28/2023)   [employer] of Occupational Health - Occupational Stress Questionnaire    Feeling of Stress : Only a little  Social Connections: Unknown (03/28/2023)   Social Connection and Isolation Panel [NHANES]    Frequency of Communication with Friends and Family: More than three times a week    Frequency of Social Gatherings with Friends and Family: More than three times a week    Attends Religious Services: Never    Database Administrator or Organizations: No    Attends Engineer, Structural: Not on file    Marital Status: Patient declined  Intimate Partner Violence: Not on file    Review of Systems  Constitutional:  Negative for chills, fever, malaise/fatigue and weight loss.  HENT:  Negative for congestion, ear discharge, ear pain, hearing loss, nosebleeds, sinus pain, sore throat and tinnitus.   Eyes:  Negative for blurred vision, double vision, pain, discharge and redness.  Respiratory:  Negative for cough, shortness of breath, wheezing and stridor.   Cardiovascular:  Negative for chest pain, palpitations and leg swelling.  Gastrointestinal:  Negative for abdominal pain, constipation, diarrhea, heartburn, nausea and vomiting.  Genitourinary:  Negative for dysuria, frequency and urgency.  Musculoskeletal:  Negative for myalgias.  Skin:  Negative for rash.  Neurological:  Negative for dizziness, tingling, seizures, weakness and headaches.  Psychiatric/Behavioral:  Negative for depression, substance abuse and suicidal ideas. The patient is not nervous/anxious.         Objective    BP 128/82 (BP Location:  Left Arm, Patient Position: Sitting, Cuff Size: Large)   Pulse (!) 113   Temp 99 F (37.2 C) (Oral)   Ht 5' 6 (1.676 m)   Wt (!) 309 lb (140.2 kg)   LMP 03/13/2023 (Exact Date)   SpO2 98%   BMI 49.87 kg/m   Physical Exam Vitals and nursing note reviewed.  Constitutional:      Appearance: Normal appearance.  HENT:     Head: Normocephalic and atraumatic.     Right Ear: Tympanic membrane, ear canal and external ear normal.     Left Ear: Tympanic membrane, ear canal and external ear normal.     Nose: Nose normal.     Mouth/Throat:     Mouth: Mucous membranes are moist.     Pharynx: Oropharynx is clear.  Eyes:     Conjunctiva/sclera: Conjunctivae normal.     Pupils: Pupils are equal, round, and reactive  to light.  Cardiovascular:     Rate and Rhythm: Normal rate and regular rhythm.     Pulses: Normal pulses.     Heart sounds: Normal heart sounds.  Pulmonary:     Effort: Pulmonary effort is normal.     Breath sounds: Normal breath sounds.  Abdominal:     General: Abdomen is flat. Bowel sounds are normal.     Palpations: Abdomen is soft.  Musculoskeletal:        General: Normal range of motion.     Cervical back: Normal range of motion.  Skin:    General: Skin is warm and dry.     Capillary Refill: Capillary refill takes less than 2 seconds.  Neurological:     General: No focal deficit present.     Mental Status: She is alert and oriented to person, place, and time. Mental status is at baseline.  Psychiatric:        Mood and Affect: Mood normal.        Behavior: Behavior normal.        Thought Content: Thought content normal.        Judgment: Judgment normal.         Assessment & Plan:  Establishing care with new doctor, encounter for Assessment & Plan: EMR reviewed briefly.   Morbid obesity with BMI of 45.0-49.9, adult [facility] Assessment & Plan: Uncontrolled.  Discussed treatment options at length with patient and her mother.  Discussed GLP 1, phentermine,  healthy weight and wellness referral and bariatric surgery.   Patient would like to weight to see her lab results and will update me which treatment option she would like.  Labs pending.  Orders: -     CBC -     Hemoglobin A1c -     Lipid panel -     Comprehensive metabolic panel -     TSH -     VITAMIN D  25 Hydroxy (Vit-D Deficiency, Fractures)  Dysmenorrhea Assessment & Plan: Chronic.   Has tried OCP in past. Contributes weight gain to OCPs.  Prefer gyn referral to discuss management and pap smear.  Orders: -     CBC -     Ambulatory referral to Gynecology  Screening for cervical cancer -     Ambulatory referral to Gynecology  Need for hepatitis C screening test -     Hepatitis C antibody  Screening for HIV (human immunodeficiency virus) -     HIV Antibody (routine testing w rflx)  Encounter for screening and preventative care Assessment & Plan: Immunizations UTD. Pap smear due; referral order placed.  Discussed the importance of a healthy diet and regular exercise in order for weight loss, and to reduce the risk of further co-morbidity.  Exam stable. Labs pending.  Follow up in 1 year for repeat physical.   Orders: -     CBC -     Hemoglobin A1c -     Lipid panel -     Comprehensive metabolic panel  Major depressive disorder, recurrent episode, moderate (HCC) Assessment & Plan: Resolved.      04/01/2023   12:28 PM 09/16/2021    2:47 PM  Depression screen PHQ 2/9  Decreased Interest 0 0  Down, Depressed, Hopeless 0 0  PHQ - 2 Score 0 0  Altered sleeping 1 0  Tired, decreased energy 1 0  Change in appetite 0 0  Feeling bad or failure about yourself  0 0  Trouble concentrating 1 0  Moving slowly or fidgety/restless 0 0  Suicidal thoughts 0 0  PHQ-9 Score 3 0  Difficult doing work/chores Not difficult at all Not difficult at all      Anxiety Assessment & Plan: Resolved.      04/01/2023   12:29 PM  GAD 7 : Generalized Anxiety Score   Nervous, Anxious, on Edge 1  Control/stop worrying 1  Worry too much - different things 1  Trouble relaxing 1  Restless 1  Easily annoyed or irritable 0  Afraid - awful might happen 0  Total GAD 7 Score 5  Anxiety Difficulty Not difficult at all        Return in about 1 year (around 03/31/2024) for physical.   Carrol Aurora, NP

## 2023-04-01 NOTE — Patient Instructions (Signed)
 Stop by the lab prior to leaving today. I will notify you of your results once received.   Please let me know what you decide regarding the weight loss medication or the referral. I will be glad to do help you either way.   You will either be contacted via phone regarding your referral to gynecology, or you may receive a letter on your MyChart portal from our referral team with instructions for scheduling an appointment. Please let us  know if you have not been contacted by anyone within two weeks.   It was a pleasure meeting you!

## 2023-04-01 NOTE — Assessment & Plan Note (Signed)
 EMR reviewed briefly.

## 2023-04-01 NOTE — Assessment & Plan Note (Signed)
 Resolved.      04/01/2023   12:28 PM 09/16/2021    2:47 PM  Depression screen PHQ 2/9  Decreased Interest 0 0  Down, Depressed, Hopeless 0 0  PHQ - 2 Score 0 0  Altered sleeping 1 0  Tired, decreased energy 1 0  Change in appetite 0 0  Feeling bad or failure about yourself  0 0  Trouble concentrating 1 0  Moving slowly or fidgety/restless 0 0  Suicidal thoughts 0 0  PHQ-9 Score 3 0  Difficult doing work/chores Not difficult at all Not difficult at all

## 2023-04-01 NOTE — Assessment & Plan Note (Signed)
 Immunizations UTD. Pap smear due; referral order placed.  Discussed the importance of a healthy diet and regular exercise in order for weight loss, and to reduce the risk of further co-morbidity.  Exam stable. Labs pending.  Follow up in 1 year for repeat physical.

## 2023-04-01 NOTE — Assessment & Plan Note (Signed)
 Resolved.      04/01/2023   12:29 PM  GAD 7 : Generalized Anxiety Score  Nervous, Anxious, on Edge 1  Control/stop worrying 1  Worry too much - different things 1  Trouble relaxing 1  Restless 1  Easily annoyed or irritable 0  Afraid - awful might happen 0  Total GAD 7 Score 5  Anxiety Difficulty Not difficult at all

## 2023-04-01 NOTE — Assessment & Plan Note (Signed)
 Chronic.   Has tried OCP in past. Contributes weight gain to OCPs.  Prefer gyn referral to discuss management and pap smear.

## 2023-04-01 NOTE — Assessment & Plan Note (Signed)
 Uncontrolled.  Discussed treatment options at length with patient and her mother.  Discussed GLP 1, phentermine, healthy weight and wellness referral and bariatric surgery.   Patient would like to weight to see her lab results and will update me which treatment option she would like.  Labs pending.

## 2023-04-02 ENCOUNTER — Encounter: Payer: Self-pay | Admitting: General Practice

## 2023-04-02 ENCOUNTER — Other Ambulatory Visit: Payer: Self-pay | Admitting: General Practice

## 2023-04-02 DIAGNOSIS — Z833 Family history of diabetes mellitus: Secondary | ICD-10-CM

## 2023-04-02 DIAGNOSIS — E559 Vitamin D deficiency, unspecified: Secondary | ICD-10-CM

## 2023-04-02 DIAGNOSIS — E782 Mixed hyperlipidemia: Secondary | ICD-10-CM

## 2023-04-02 LAB — HEPATITIS C ANTIBODY: Hepatitis C Ab: NONREACTIVE

## 2023-04-02 LAB — HIV ANTIBODY (ROUTINE TESTING W REFLEX): HIV 1&2 Ab, 4th Generation: NONREACTIVE

## 2023-04-02 MED ORDER — ZEPBOUND 2.5 MG/0.5ML ~~LOC~~ SOAJ: 2.5000 mg | SUBCUTANEOUS | 0 refills | Status: AC

## 2023-04-02 MED ORDER — ZEPBOUND 5 MG/0.5ML ~~LOC~~ SOAJ: 5.0000 mg | SUBCUTANEOUS | 0 refills | Status: AC

## 2023-04-02 MED ORDER — VITAMIN D (ERGOCALCIFEROL) 1.25 MG (50000 UNIT) PO CAPS: 50000.0000 [IU] | ORAL_CAPSULE | ORAL | 0 refills | Status: AC

## 2023-04-03 ENCOUNTER — Telehealth: Payer: Self-pay

## 2023-04-03 ENCOUNTER — Other Ambulatory Visit (HOSPITAL_COMMUNITY): Payer: Self-pay

## 2023-04-03 NOTE — Telephone Encounter (Signed)
 Pharmacy Patient Advocate Encounter   Received notification from Patient Advice Request messages that prior authorization for Zepbound  2.5MG /0.5ML pen-injectors is required/requested.   Insurance verification completed.   The patient is insured through [facility] .   Per test claim: PA required; PA submitted to above mentioned insurance via CoverMyMeds Key/confirmation #/EOC [id number] Status is pending

## 2023-04-06 NOTE — Telephone Encounter (Signed)
Sent mychart message about denial

## 2023-04-06 NOTE — Telephone Encounter (Signed)
 Pharmacy Patient Advocate Encounter  Received notification from [facility] that Prior Authorization for Zepbound  2.5MG /0.5ML pen-injectors  has been DENIED.  See denial reason below. No denial letter attached in CMM. Will attach denial letter to Media tab once received.   PA #/Case ID/Reference #: [id number]   Denied. This health benefit plan does not cover the following services, supplies, drugs or charges: Any treatment or regimen, medical or surgical, for the purpose of reducing or controlling the weight of the member, or for the treatment of obesity, except for surgical treatment of morbid obesity, or as specifically covered by this health benefit plan.

## 2023-05-13 ENCOUNTER — Encounter: Payer: Self-pay | Admitting: Certified Nurse Midwife

## 2023-05-13 ENCOUNTER — Ambulatory Visit: Payer: BC Managed Care – PPO | Admitting: Certified Nurse Midwife

## 2023-05-13 VITALS — BP 103/62 | HR 107 | Resp 16 | Ht 66.0 in | Wt 296.7 lb

## 2023-05-13 DIAGNOSIS — Z3202 Encounter for pregnancy test, result negative: Secondary | ICD-10-CM

## 2023-05-13 DIAGNOSIS — N946 Dysmenorrhea, unspecified: Secondary | ICD-10-CM

## 2023-05-13 DIAGNOSIS — Z30017 Encounter for initial prescription of implantable subdermal contraceptive: Secondary | ICD-10-CM

## 2023-05-13 LAB — POCT URINE PREGNANCY: Preg Test, Ur: NEGATIVE

## 2023-05-13 MED ORDER — ETONOGESTREL 68 MG ~~LOC~~ IMPL
68.0000 mg | DRUG_IMPLANT | Freq: Once | SUBCUTANEOUS | Status: AC
  Administered 2023-05-13: 68 mg via SUBCUTANEOUS

## 2023-05-13 NOTE — Progress Notes (Signed)
    GYNECOLOGY PROCEDURE NOTE  Patient is a 23 y.o. G0P0000 presenting for Nexplanon insertion as her desired means of contraception after discussion of options including pill, injectable & IUD. She has taken pills in the past and desires a different means of controlling her menstrual cycles. She is abstinent.  She provided informed consent, signed copy in the chart, time out was performed. Pregnancy test was negative, with self reported LMP of Patient's last menstrual period was 04/17/2023.   She understands that Nexplanon is a progesterone only therapy, and that patients often have irregular and unpredictable vaginal bleeding or amenorrhea. She understands that other side effects are possible related to systemic progesterone, including but not limited to, headaches, breast tenderness, nausea, and irritability. While effective at preventing pregnancy long acting reversible contraceptives do not prevent transmission of sexually transmitted diseases and use of barrier methods for this purpose was discussed. The placement procedure for Nexplanon was reviewed with the patient in detail including risks of nerve injury, infection, bleeding and injury to other muscles or tendons. She understands that the Nexplanon implant is good for 3 years and needs to be removed at the end of that time.  She understands that Nexplanon is an extremely effective option for contraception, with failure rate of <1%. This information is reviewed today and all questions were answered. Informed consent was obtained, both verbally and written.   The patient is healthy and has no contraindications to Nexplanon use. Urine pregnancy test was performed today and was negative.  Review of Systems  Constitutional: Negative.   Cardiovascular: Negative.   Genitourinary:        Dysmennorhea, irregular cycles    Vital Signs: BP 103/62   Pulse (!) 107   Resp 16   Ht 5\' 6"  (1.676 m)   Wt 296 lb 11.2 oz (134.6 kg)   LMP 04/17/2023    BMI 47.89 kg/m  Constitutional: Well nourished, well developed female in no acute distress.  Skin: Warm and dry.  Cardiovascular: Regular rate Respiratory:  Normal respiratory effort Psych: Alert and Oriented x3. No memory deficits. Normal mood and affect.    Procedure Appropriate time out taken.  Patient placed in dorsal supine with left arm above head, elbow flexed at 90 degrees, arm resting on examination table.  The bicipital groove was palpated and site 8-10cm proximal to the medial epicondyle was indentified . The insertion site was prepped with a two betadine swabs and then injected with 2 ml of 1% lidocaine without epinephrine.  Nexplanon removed form sterile blister packaging,  Device confirmed in needle, before inserting full length of needle, tenting up the skin as the needle was advanced.  The drug eluding rod was then deployed by pulling back the slider per the manufactures recommendation.  The implant was palpable by the clinician as well as the patient.  The insertion site was dressed with a steri strip and band aid before applying  a kerlex bandage pressure dressing. Minimal blood loss was noted during the procedure.  The patient tolerated the procedure well.   She was instructed to wear the bandage for 24 hours, call with any signs of infection.  She was given the Nexplanon card and instructed to have the rod removed in 5 years.   Dominica Severin, CNM [facility]

## 2023-05-17 ENCOUNTER — Encounter: Payer: Self-pay | Admitting: Certified Nurse Midwife

## 2023-05-20 ENCOUNTER — Encounter: Payer: Self-pay | Admitting: General Practice

## 2023-05-29 ENCOUNTER — Encounter: Payer: Self-pay | Admitting: General Practice

## 2023-05-29 ENCOUNTER — Ambulatory Visit: Admitting: General Practice

## 2023-05-29 VITALS — BP 116/84 | HR 85 | Temp 98.6°F | Ht 66.0 in | Wt 290.0 lb

## 2023-05-29 DIAGNOSIS — G4700 Insomnia, unspecified: Secondary | ICD-10-CM | POA: Insufficient documentation

## 2023-05-29 DIAGNOSIS — T753XXA Motion sickness, initial encounter: Secondary | ICD-10-CM

## 2023-05-29 DIAGNOSIS — R42 Dizziness and giddiness: Secondary | ICD-10-CM

## 2023-05-29 LAB — CBC
HCT: 41.9 % (ref 36.0–46.0)
Hemoglobin: 13.6 g/dL (ref 12.0–15.0)
MCHC: 32.6 g/dL (ref 30.0–36.0)
MCV: 79.9 fl (ref 78.0–100.0)
Platelets: 344 10*3/uL (ref 150.0–400.0)
RBC: 5.24 Mil/uL — ABNORMAL HIGH (ref 3.87–5.11)
RDW: 15.1 % (ref 11.5–15.5)
WBC: 6.9 10*3/uL (ref 4.0–10.5)

## 2023-05-29 LAB — BASIC METABOLIC PANEL WITH GFR
BUN: 13 mg/dL (ref 6–23)
CO2: 25 meq/L (ref 19–32)
Calcium: 9.6 mg/dL (ref 8.4–10.5)
Chloride: 105 meq/L (ref 96–112)
Creatinine, Ser: 0.72 mg/dL (ref 0.40–1.20)
GFR: 119.1 mL/min (ref >60.00)
Glucose, Bld: 94 mg/dL (ref 70–99)
Potassium: 3.8 meq/L (ref 3.5–5.1)
Sodium: 139 meq/L (ref 135–145)

## 2023-05-29 MED ORDER — SCOPOLAMINE 1 MG/3DAYS TD PT72: 1.0000 | MEDICATED_PATCH | TRANSDERMAL | 12 refills | Status: AC

## 2023-05-29 MED ORDER — MECLIZINE HCL 12.5 MG PO TABS: 12.5000 mg | ORAL_TABLET | Freq: Three times a day (TID) | ORAL | 0 refills | Status: AC | PRN

## 2023-05-29 NOTE — Assessment & Plan Note (Addendum)
 Controlled. New Rx sent for scopolamine patch.

## 2023-05-29 NOTE — Progress Notes (Signed)
 Established Patient Office Visit  Subjective   Patient ID: Victoria Underwood, female    DOB: September 12, 2000  Age: 23 y.o. MRN: [id number]  Chief Complaint  Patient presents with   Dizziness    X couple weeks ago off and on. Patient notices it the most when she's been sitting and goes to sit up or stand.     Dizziness Pertinent negatives include no abdominal pain, chest pain, chills, fever, headaches, nausea or vomiting.   Victoria Underwood is a 23 year old female with past medical history of anxiety, dysmenorrhea, major depressive disorder, morbid obesity presents today to discuss dizziness.   Dizziness  She reports new onset dizziness, started around end of February. She describes it as feeling like room is spinning, occurs intermittently, and typically lasts a few seconds.  It typically occurs when she is tilting head down and standing up from siting position. It is usually relieved by sitting still. She has not started new medications around the time the dizziness started. She did get new glasses at the beginning of March.   Associated symptoms: No hearing loss No tinnitus  No chest discomfort No heart palpitations  No heart racing No numbness or tingling of extremities  No nausea No vomiting  No speech difficulty No visual changes    Wt Readings from Last 3 Encounters:  05/29/23 290 lb (131.5 kg)  05/13/23 296 lb 11.2 oz (134.6 kg)  04/01/23 (!) 309 lb (140.2 kg)    BP Readings from Last 3 Encounters:  05/29/23 116/84  05/13/23 103/62  04/01/23 128/82      Lab Results  Component Value Date   WBC 7.0 04/01/2023   HGB 13.8 04/01/2023   HCT 42.6 04/01/2023   MCV 79.6 04/01/2023   PLT 355.0 04/01/2023   Lab Results  Component Value Date   NA 140 04/01/2023   K 4.1 04/01/2023   CO2 24 04/01/2023   BUN 17 04/01/2023   CREATININE 0.66 04/01/2023   CALCIUM 9.7 04/01/2023   GLUCOSE 90 04/01/2023     Insomnia: has been having trouble sleeping for the past week.  Goes to bed midnight. Wakes up around 7:30. Concern is staying asleep. She wakes up in the middle night of the night. Does take day time naps that usually last 1-2 hours couple times a week. Does not drink caffeine. Her dinner is usually around 7:30 or 8. She has a snack (rice krispy) around 9.  Denies any anxiety or depression symptoms.  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   Patient Active Problem List   Diagnosis Date Noted   Dizziness 05/29/2023   Insomnia 05/29/2023   Major depressive disorder, recurrent episode, moderate (HCC) 04/01/2023   Morbid obesity with BMI of 45.0-49.9, adult (HCC) 04/01/2023   Dysmenorrhea 04/01/2023   Encounter for screening and preventative care 04/01/2023   Anxiety 03/31/2023   Motion sickness 09/16/2021   Past Medical History:  Diagnosis Date   Anxiety    Depression    History of ovarian cyst    Torsion of ovary    Allergies  Allergen Reactions   Avocado Diarrhea, Nausea And Vomiting, Nausea Only and Other (See Comments)         05/29/2023   11:01 AM 04/01/2023   12:28 PM 09/16/2021    2:47 PM  Depression screen PHQ 2/9  Decreased Interest 0 0 0  Down, Depressed, Hopeless 0 0 0  PHQ - 2 Score 0 0 0  Altered sleeping 3  1 0  Tired, decreased energy 1 1 0  Change in appetite 0 0 0  Feeling bad or failure about yourself  0 0 0  Trouble concentrating 0 1 0  Moving slowly or fidgety/restless 0 0 0  Suicidal thoughts 0 0 0  PHQ-9 Score 4 3 0  Difficult doing work/chores Not difficult at all Not difficult at all Not difficult at all       05/29/2023   11:01 AM 04/01/2023   12:29 PM  GAD 7 : Generalized Anxiety Score  Nervous, Anxious, on Edge 0 1  Control/stop worrying 0 1  Worry too much - different things 0 1  Trouble relaxing 0 1  Restless 0 1  Easily annoyed or irritable 0 0  Afraid - awful might happen 0 0  Total GAD 7 Score 0 5  Anxiety Difficulty Not difficult at all Not difficult at all      Review of Systems   Constitutional:  Negative for chills and fever.  Respiratory:  Negative for shortness of breath.   Cardiovascular:  Negative for chest pain.  Gastrointestinal:  Negative for abdominal pain, constipation, diarrhea, heartburn, nausea and vomiting.  Genitourinary:  Negative for dysuria, frequency and urgency.  Neurological:  Positive for dizziness. Negative for headaches.  Endo/Heme/Allergies:  Negative for polydipsia.  Psychiatric/Behavioral:  Negative for depression and suicidal ideas. The patient is not nervous/anxious.       Objective:     BP 116/84 (BP Location: Left Arm, Patient Position: Sitting, Cuff Size: Large)   Pulse 85   Temp 98.6 F (37 C) (Oral)   Ht 5\' 6"  (1.676 m)   Wt 290 lb (131.5 kg)   LMP 05/22/2023 (Exact Date)   SpO2 96%   BMI 46.81 kg/m  BP Readings from Last 3 Encounters:  05/29/23 116/84  05/13/23 103/62  04/01/23 128/82   Wt Readings from Last 3 Encounters:  05/29/23 290 lb (131.5 kg)  05/13/23 296 lb 11.2 oz (134.6 kg)  04/01/23 (!) 309 lb (140.2 kg)      Physical Exam Vitals and nursing note reviewed.  Constitutional:      Appearance: Normal appearance.  Cardiovascular:     Rate and Rhythm: Normal rate and regular rhythm.     Pulses: Normal pulses.     Heart sounds: Normal heart sounds.  Pulmonary:     Effort: Pulmonary effort is normal.     Breath sounds: Normal breath sounds.  Neurological:     Mental Status: She is alert and oriented to person, place, and time.     Cranial Nerves: Cranial nerves 2-12 are intact.     Sensory: Sensation is intact.     Motor: Motor function is intact.     Coordination: Coordination is intact.     Gait: Gait is intact.  Psychiatric:        Mood and Affect: Mood normal.        Behavior: Behavior normal.        Thought Content: Thought content normal.        Judgment: Judgment normal.      No results found for any visits on 05/29/23.     The ASCVD Risk score (Arnett DK, et al., 2019) failed  to calculate for the following reasons:   The 2019 ASCVD risk score is only valid for ages 5 to 22    Assessment & Plan:  Dizziness Assessment & Plan: Symptoms suggestive of positional vertigo.  Neuro exam stable today. Discussed checking  blood pressures at home to rule out orthostatic hypotension.  She have a Nexplanon placed on 05/13/2023.  She has been having some breakthrough and intermittent bleeding. Will check CBC and BMP.  Start meclizine 25 mg 3 times daily as needed for dizziness.  Rx sent.  Follow-up in 2-3 weeks.  Orders: -     CBC -     Meclizine HCl; Take 1 tablet (12.5 mg total) by mouth 3 (three) times daily as needed for dizziness.  Dispense: 30 tablet; Refill: 0 -     Basic metabolic panel with GFR  Motion sickness, initial encounter Assessment & Plan: Controlled. New Rx sent for scopolamine patch.  Orders: -     Scopolamine; Place 1 patch (1.5 mg total) onto the skin every 3 (three) days.  Dispense: 10 patch; Refill: 12  Insomnia, unspecified type Assessment & Plan: Suspect this could be related to the daytime naps.  Discussed monitoring symptoms. Having good bedtime habits.  Handout given.  Start melatonin 5 mg at bedtime as needed.  Follow-up in 2 to 3 weeks.    Return in about 3 weeks (around 06/19/2023) for dizziness.    [place] Charon, NP

## 2023-05-29 NOTE — Assessment & Plan Note (Addendum)
 Symptoms suggestive of positional vertigo.  Neuro exam stable today. Discussed checking blood pressures at home to rule out orthostatic hypotension.  She have a Nexplanon placed on 05/13/2023.  She has been having some breakthrough and intermittent bleeding. Will check CBC and BMP.  Start meclizine 25 mg 3 times daily as needed for dizziness.  Rx sent.  Follow-up in 2-3 weeks.

## 2023-05-29 NOTE — Patient Instructions (Addendum)
 Stop by the lab prior to leaving today. I will notify you of your results once received.   Start Melatonin 5 mg once daily at bedtime. It can be purchased over the counter.   Start Meclizine 12.5 mg three times a day as needed.  Keep posted on your symptoms.   Follow up in 2-3 weeks, my chart visit is ok if needed.  It was a pleasure to see you today!

## 2023-05-29 NOTE — Assessment & Plan Note (Addendum)
 Suspect this could be related to the daytime naps.  Discussed monitoring symptoms. Having good bedtime habits.  Handout given.  Start melatonin 5 mg at bedtime as needed.  Follow-up in 2 to 3 weeks.

## 2023-07-31 ENCOUNTER — Encounter: Payer: Self-pay | Admitting: General Practice

## 2023-07-31 DIAGNOSIS — Z111 Encounter for screening for respiratory tuberculosis: Secondary | ICD-10-CM

## 2023-08-04 ENCOUNTER — Encounter: Payer: Self-pay | Admitting: General Practice

## 2024-01-15 ENCOUNTER — Ambulatory Visit: Admitting: General Practice

## 2024-01-20 ENCOUNTER — Ambulatory Visit: Admitting: General Practice

## 2024-04-04 ENCOUNTER — Encounter: Payer: BC Managed Care – PPO | Admitting: General Practice
